# Patient Record
Sex: Male | Born: 1945 | Race: White | Hispanic: No | Marital: Married | Smoking: Former smoker
Health system: Southern US, Community
[De-identification: ages and names within clinical notes are randomized; demographics above are authoritative.]

---

## 1999-10-20 ENCOUNTER — Emergency Department (HOSPITAL_COMMUNITY): Admission: EM | Admit: 1999-10-20 | Discharge: 1999-10-20 | Payer: Self-pay | Admitting: *Deleted

## 2004-09-11 ENCOUNTER — Encounter: Admission: RE | Admit: 2004-09-11 | Discharge: 2004-09-11 | Payer: Self-pay | Admitting: Endocrinology

## 2005-01-14 ENCOUNTER — Encounter (INDEPENDENT_AMBULATORY_CARE_PROVIDER_SITE_OTHER): Payer: Self-pay | Admitting: *Deleted

## 2005-01-14 ENCOUNTER — Ambulatory Visit (HOSPITAL_COMMUNITY): Admission: RE | Admit: 2005-01-14 | Discharge: 2005-01-14 | Payer: Self-pay | Admitting: Gastroenterology

## 2007-06-30 ENCOUNTER — Encounter: Admission: RE | Admit: 2007-06-30 | Discharge: 2007-06-30 | Payer: Self-pay | Admitting: Rheumatology

## 2007-08-13 ENCOUNTER — Encounter: Admission: RE | Admit: 2007-08-13 | Discharge: 2007-08-13 | Payer: Self-pay | Admitting: Rheumatology

## 2009-05-23 IMAGING — US US ABDOMEN COMPLETE
1 series · 13 of 25 positions shown · non-contrast
Comparison: none

CLINICAL DATA: Leukopenia.  Rheumatoid arthritis.  
 ABDOMEN ULTRASOUND:
TECHNIQUE: Complete abdominal ultrasound examination was performed including evaluation of the liver, gallbladder, bile ducts, pancreas, kidneys, spleen, IVC, and abdominal aorta.

[Series 1: us abdomen complete · 0.28mm/px · 13 of 114 slices shown]
[im 1/114]
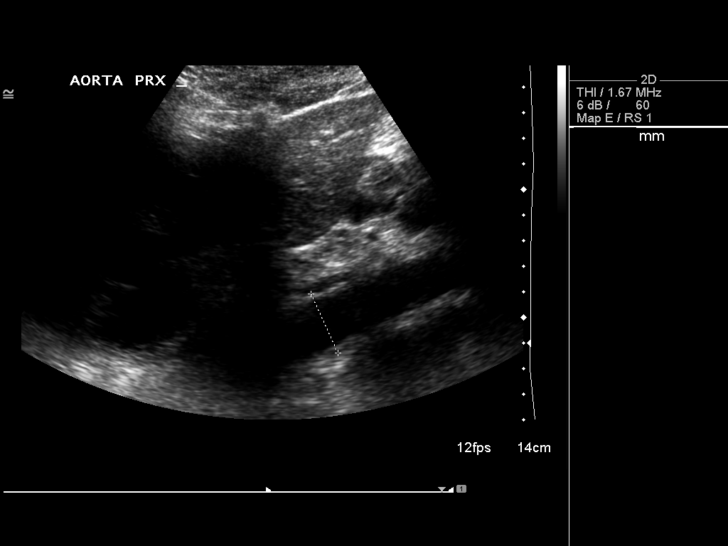
[im 10/114]
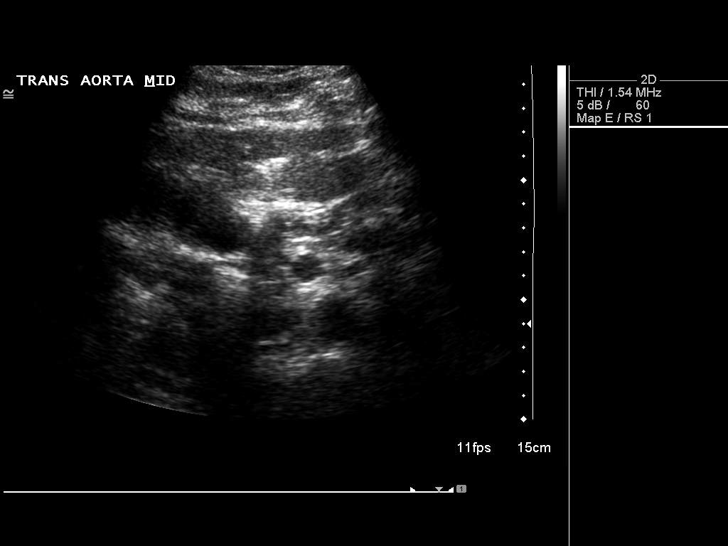
[im 19/114]
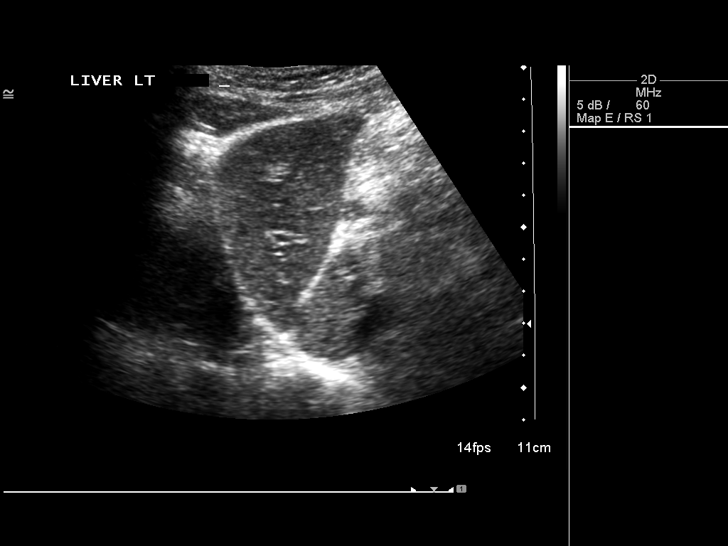
[im 29/114]
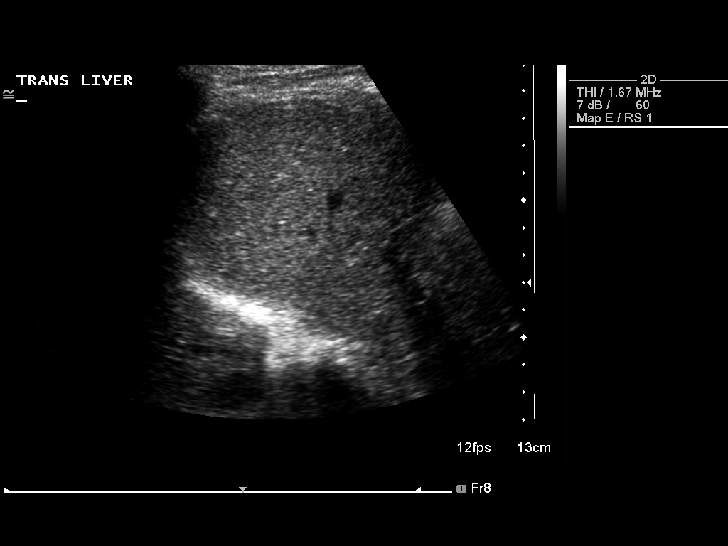
[im 38/114]
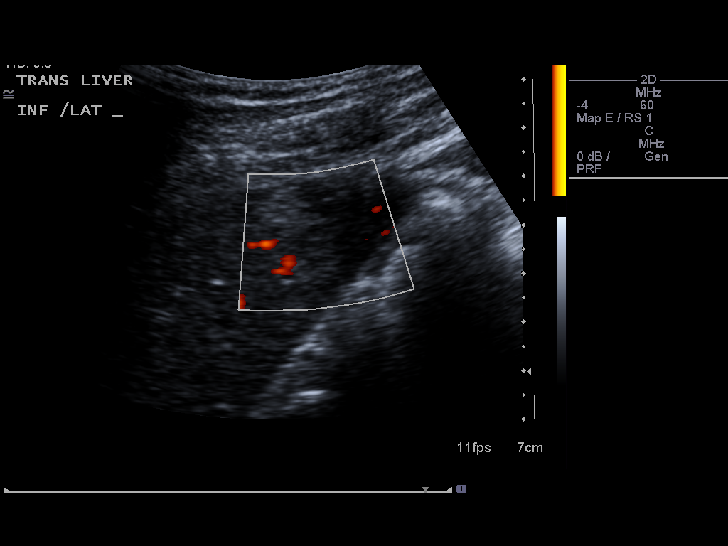
[im 48/114]
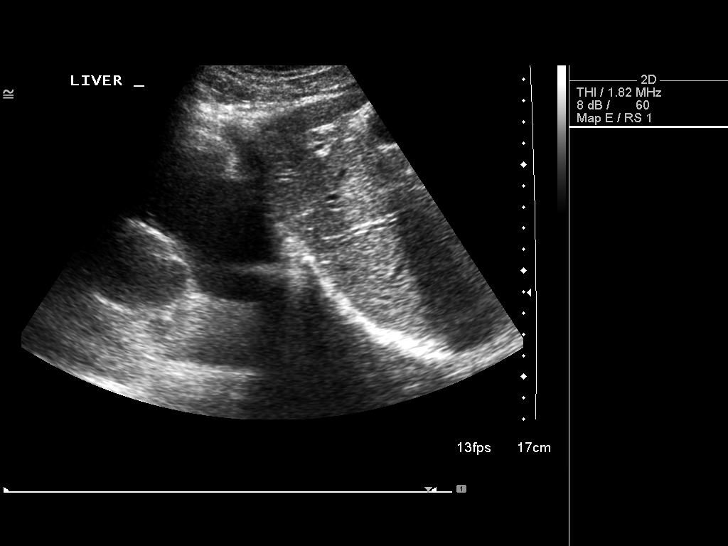
[im 57/114]
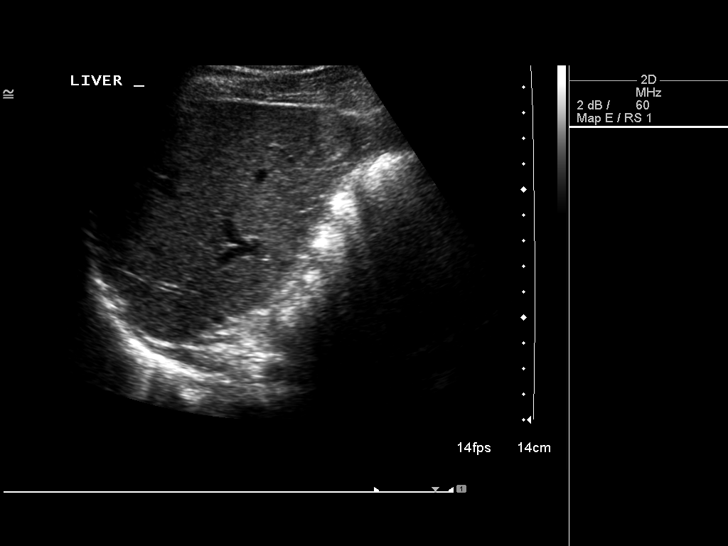
[im 66/114]
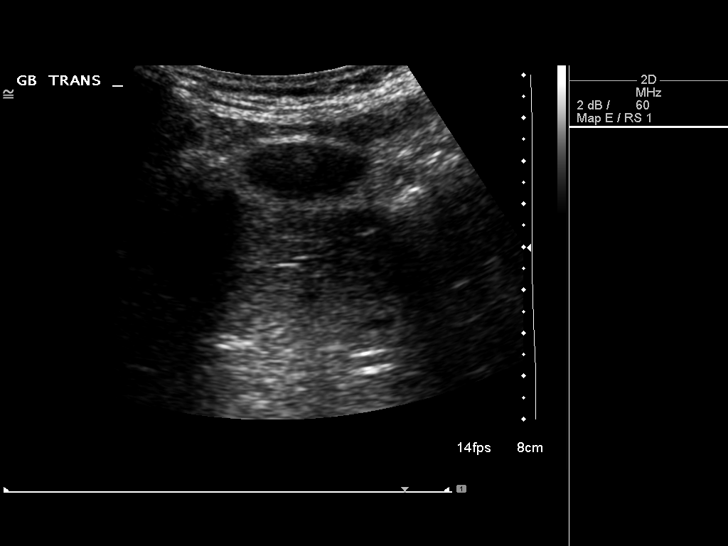
[im 76/114]
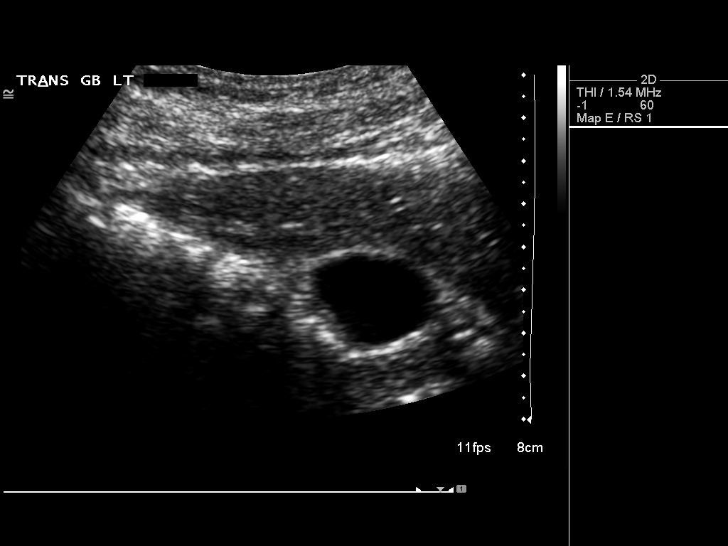
[im 85/114]
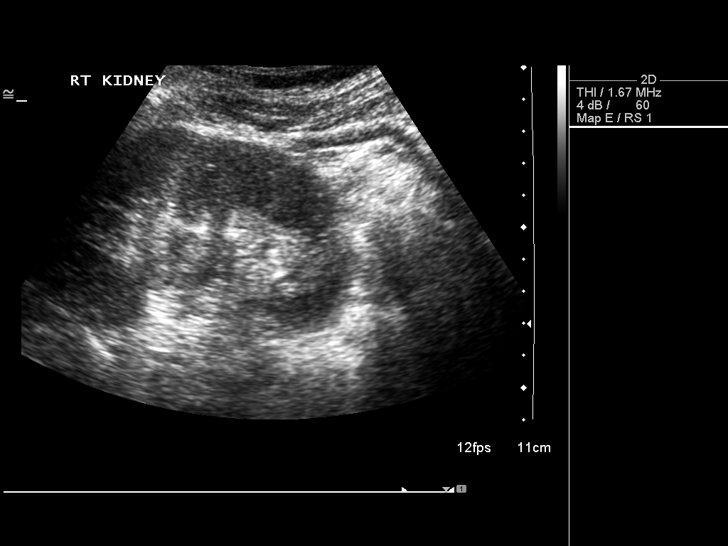
[im 95/114]
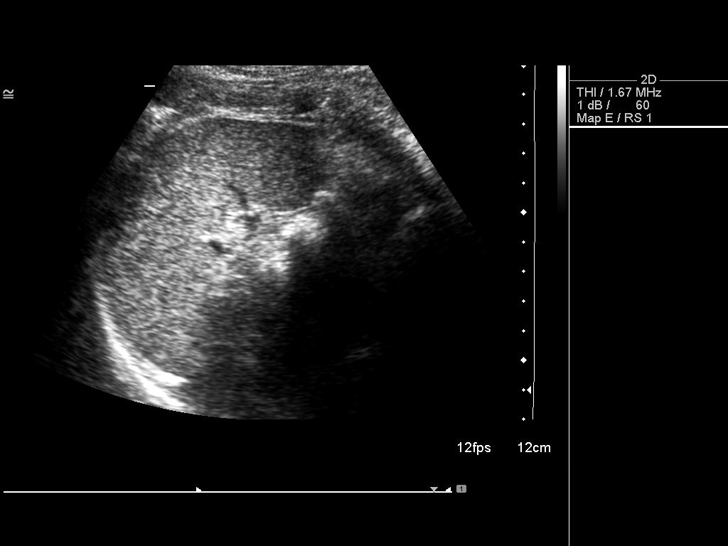
[im 104/114]
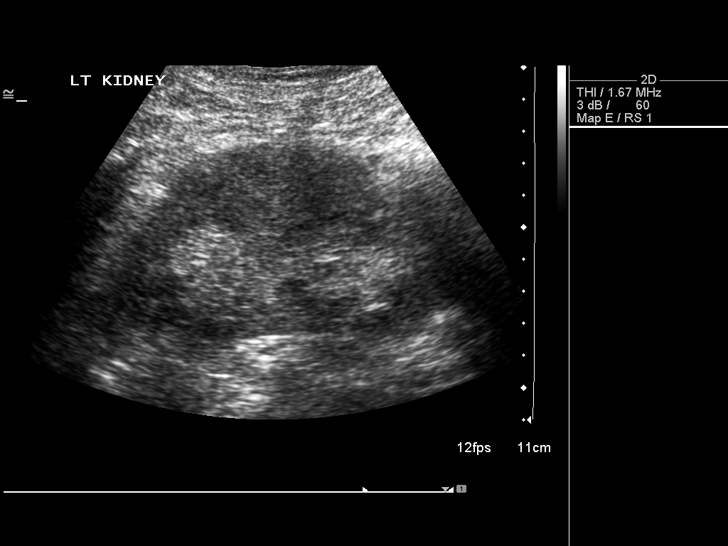
[im 114/114]
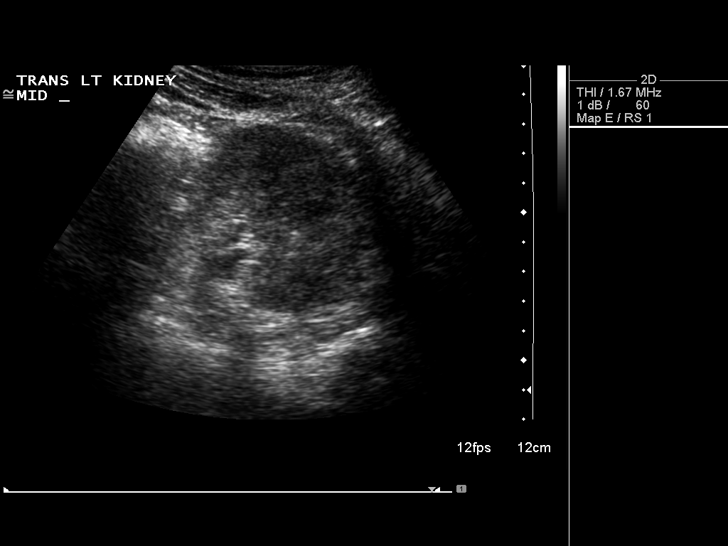

[13 of 25 positions shown; findings below may reference images not displayed]

FINDINGS: The gallbladder is normal.  There are no dilated bowel ducts.  Maximum diameter of the common bile duct is 3.1 mm.  There is a subtle inhomogeneous slightly hyperechoic 2.2 x 1.6 x 2.1 cm lesion in the anterior aspect of the right lobe of the liver inferiorly and laterally just lateral to the gallbladder.  It is slightly lobular in appearance, and the appearance is not completely consistent with a benign hemangioma.  The remainder of the liver parenchyma is normal.  The inferior vena cava is normal.  The spleen is normal measuring 10.4 cm in maximum diameter.  Both kidneys are 10.9 cm in length and the left kidney is normal.  There is a 4 mm echogenic area laterally in the right kidney probably a tiny angiomyolipoma.  This is not felt to be significant.  The maximum diameter of the abdominal aorta is 2.5 cm.  
 The head and body of the pancreas appear normal.  The tail of the pancreas is partially obscured by bowel gas.
IMPRESSION: 1.  2.2 cm in maximum diameter lobulated inhomogeneous lesion in the inferolateral aspect of the right lobe of the liver.  This could represent an atypical hemangioma, but I recommend CT scan of the liver with contrast with multiphase imaging for further evaluation unless the patient has had outside prior imaging studies of the liver.  We have none in our system.
 2.  Specifically, the spleen appears normal in size.

## 2009-07-06 IMAGING — CT CT ABDOMEN WO/W CM
2 of 4 series · 17 of 46 positions shown, 19 images · IV contrast (READICAT/WATER)
Comparison: Abdominal ultrasound 06/30/07.

CLINICAL DATA: Weight loss with liver lesion on ultrasound.  No abdominal complaints or history of malignancy.
 ABDOMEN CT WITHOUT AND WITH CONTRAST ? 08/13/07:
TECHNIQUE: Multidetector CT imaging of the abdomen was performed following the standard protocol both before and during bolus administration of intravenous contrast.
 Contrast:  100 cc Omnipaque 350 IV.  Oral contrast was given.

[Series 3: arterial/venous liver · axial · arterial · 0.66mm/px · z∈[-197,-12]mm · 14 of 122 slices shown, 16 images]
[im 6/122  soft-tissue]
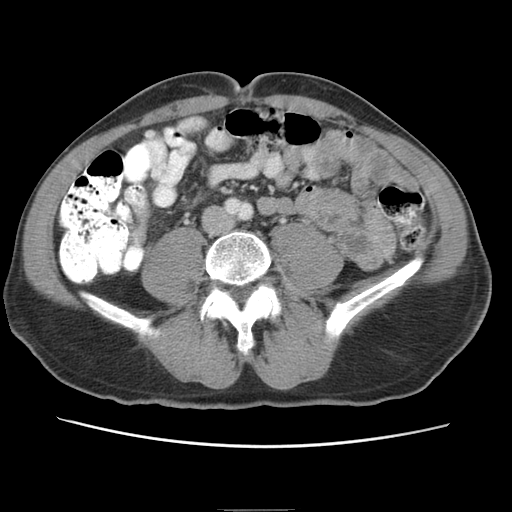
[im 6/122  bone]
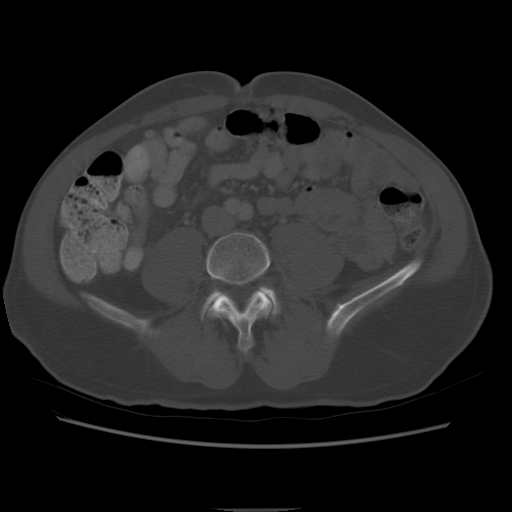
[im 16/122  soft-tissue]
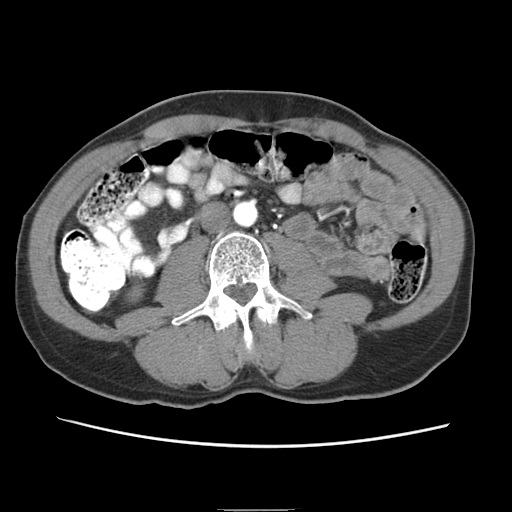
[im 22/122  soft-tissue]
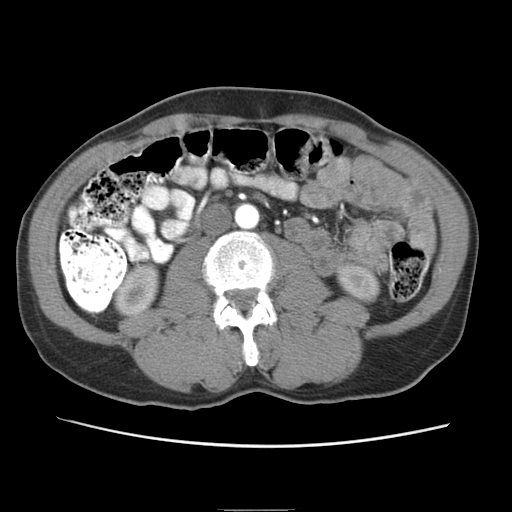
[im 32/122  soft-tissue]
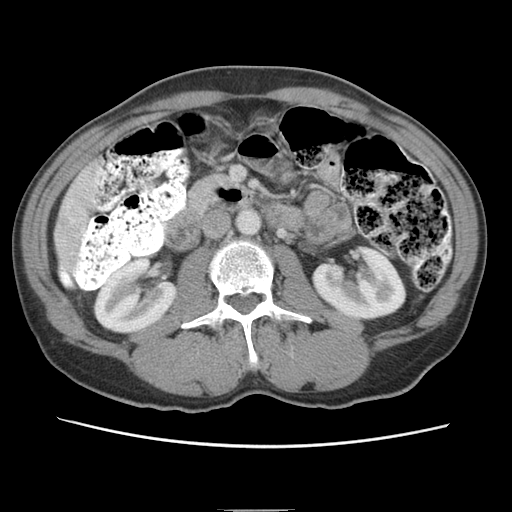
[im 43/122  soft-tissue]
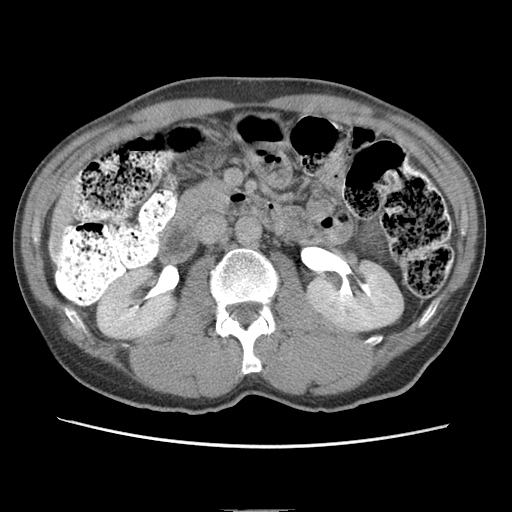
[im 48/122  soft-tissue]
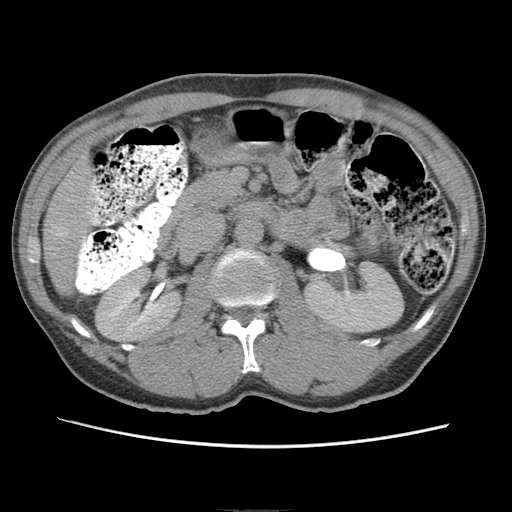
[im 58/122  soft-tissue]
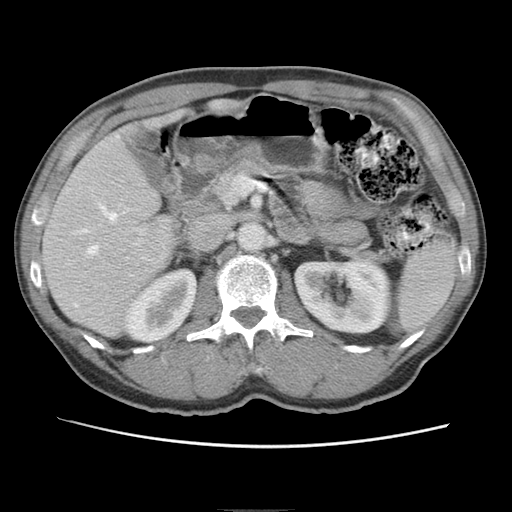
[im 64/122  soft-tissue]
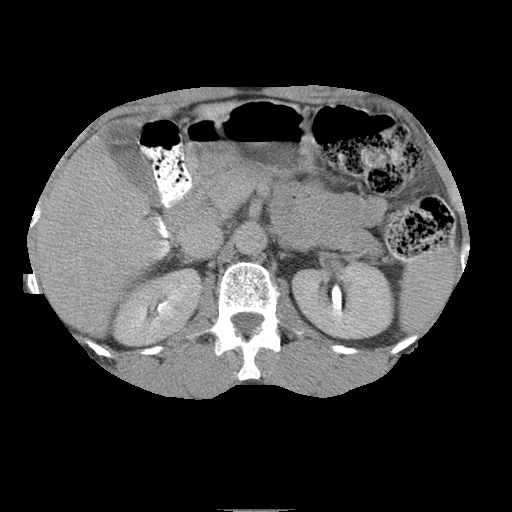
[im 74/122  soft-tissue]
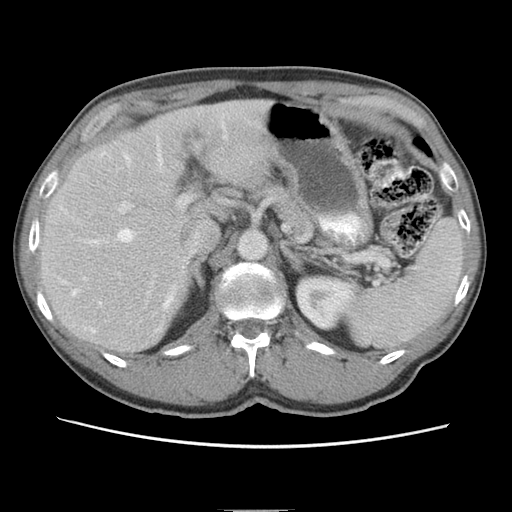
[im 74/122  bone]
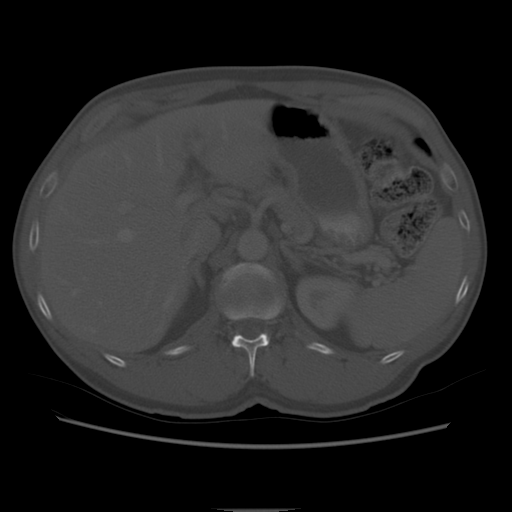
[im 79/122  soft-tissue]
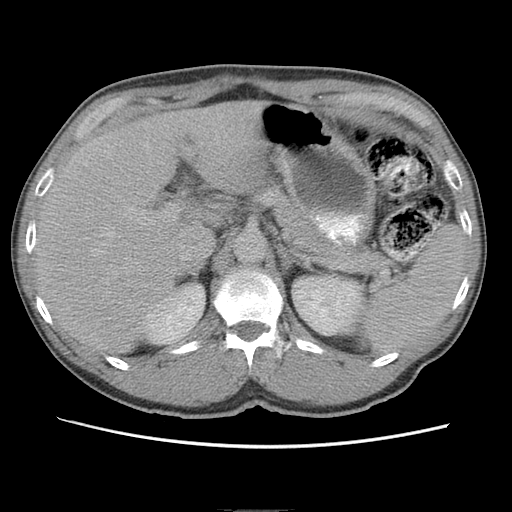
[im 90/122  soft-tissue]
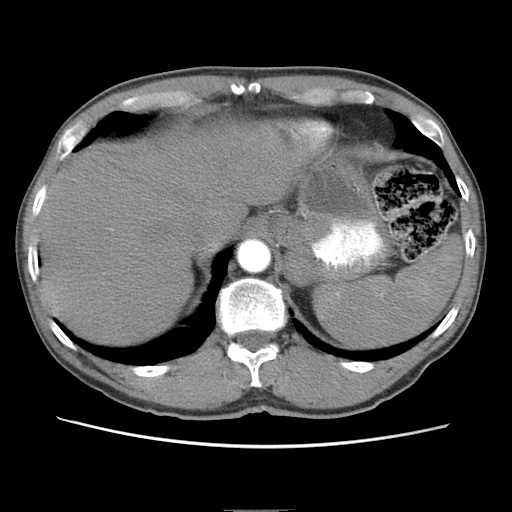
[im 100/122  soft-tissue]
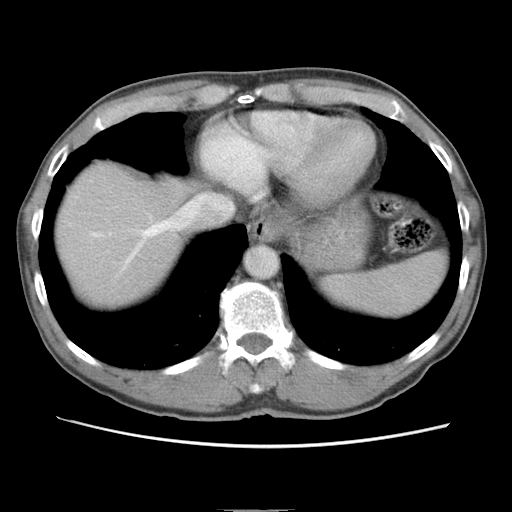
[im 106/122  soft-tissue]
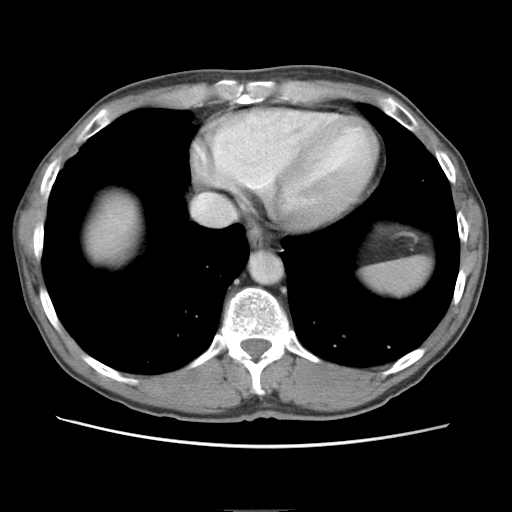
[im 116/122  soft-tissue]
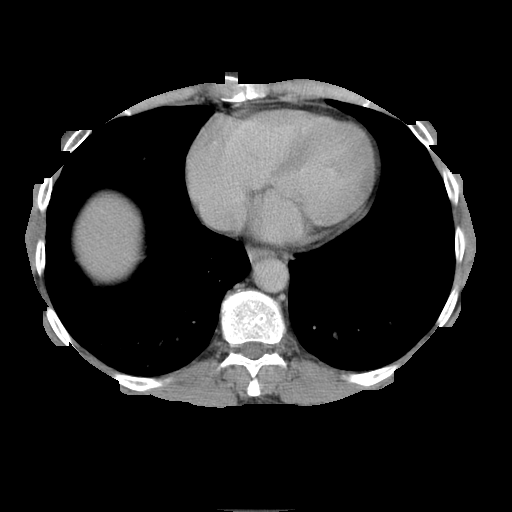

[Series 104: coronal abd · coronal · 0.66mm/px · 3 of 110 slices shown]
[im 37/110  soft-tissue]
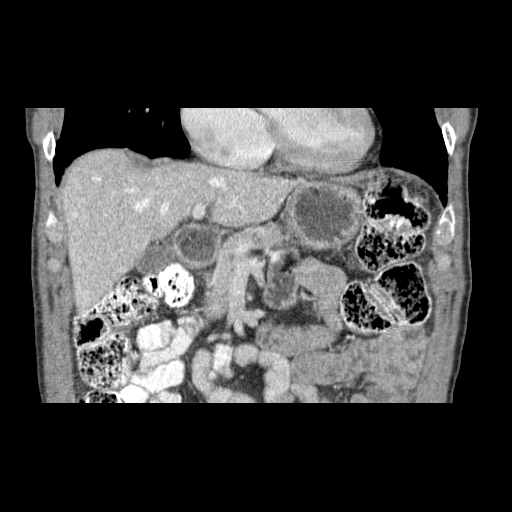
[im 49/110  soft-tissue]
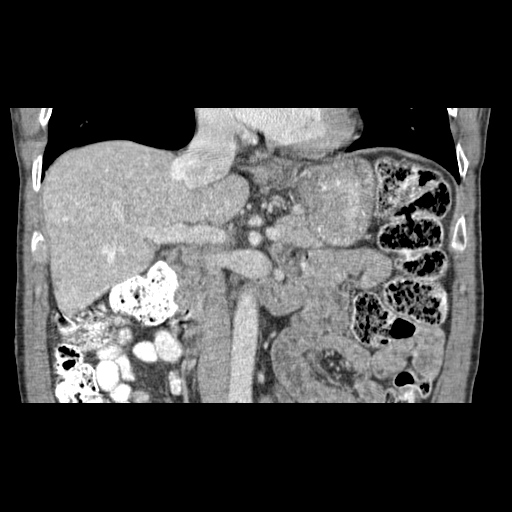
[im 61/110  soft-tissue]
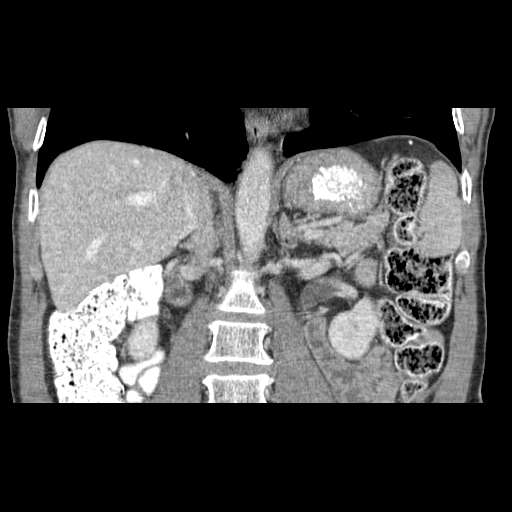

[17 of 46 positions shown; findings below may reference images not displayed]

FINDINGS: Corresponding with the lesion demonstrated on ultrasound is a 1.3 x 2.1 x 2.1 cm lesion inferiorly in the right hepatic lobe.  This is best seen on the portal phase images.  The lesion demonstrates peripheral discontinuous enhancement which progresses centrally.  On delayed imaging through the kidneys, the lesion is nearly fully opacified.  The CT appearance is typical of a hemangioma.  No other liver lesions are identified.
 The spleen, adrenal glands, gallbladder, pancreas, and kidneys appear unremarkable.  No enlarged abdominal lymph nodes are seen.  There is a moderate amount of stool throughout the colon.  Posteriorly in the upper pole of the right kidney is a 6 mm lesion which shows low density.  On the sagittal reconstructed images, I am able to document attenuation of minus 22 Hounsfield units, and this may reflect a small angiomyolipoma suggested on the prior ultrasound.
IMPRESSION: 1.  The liver lesion demonstrates CT features typical of a hemangioma.  There are no suspicious liver lesions.
 2.  6 mm right renal angiomyolipoma.

## 2011-05-03 NOTE — Op Note (Signed)
NAME:  JERIMAH, WITUCKI NO.:  [id number]   MEDICAL RECORD NO.:  [id number]          PATIENT TYPE:  AMB   LOCATION:  ENDO                         FACILITY:  [facility]   PHYSICIAN:  Petra Kuba, M.D.    DATE OF BIRTH:  [birth year]   DATE OF PROCEDURE:  01/14/2005  DATE OF DISCHARGE:                                 OPERATIVE REPORT   PROCEDURE:  Colonoscopy with polypectomy.   INDICATION:  Screening.   Consent was signed after the risks, benefits, methods, and options were  thoroughly discussed in the office.   MEDICINES USED:  Demerol 75 mg, Versed 7.5 mg.   DESCRIPTION OF PROCEDURE:  Rectal inspection was pertinent for external  hemorrhoids, small.  Digital exam was negative.  The video pediatric  adjustable colonoscope was inserted and with abdominal pressure easily able  to advance around the colon to the cecum.  No abnormalities were seen on  insertion.  The cecum was identified by the appendiceal orifice and the  ileocecal valve.  The prep was adequate, there was some liquid stool that  required washing and suctioning.  On slow withdrawal through the colon, in  the mid ascending a small polyp was seen, snare electrocautery applied and  the polyps was suctioned through the scope and collected in the trap.  The  scope was further withdrawn.  There were some occasional left-sided  diverticula but no other abnormalities as we slowly withdrew back to the  rectum.  Anorectal pull-through on retroflexion revealed some small  hemorrhoids but also revealed a tiny polyp on retroflexion which was hot  biopsied x1 and put in a separate container.  The scope was straightened and  readvanced shortly to the left side of the colon, air was suctioned and the  scope removed.  The patient tolerated the procedure well and there were no  obvious immediate complications.   ENDOSCOPIC DIAGNOSES:  1.  Internal and external hemorrhoids.  2.  Left-sided diverticula.  3.  Tiny rectal  polyp, hot biopsied on retroflexion.  4.  Ascending small polyp.  5.  Otherwise within normal limits to the cecum.   PLAN:  Await pathology, happy to see him back p.r.n., otherwise return care  to Drs. Lucianne Muss and Truslow for customary healthcare maintenance to include  yearly rectal guaiacs.      MEM/MEDQ  D:  01/14/2005  T:  01/14/2005  Job:  [id number]   cc:   Aundra Dubin, M.D.   Reather Littler, M.D.  [address]  [place]  [place] [address]  Fax: [phone]

## 2011-07-18 ENCOUNTER — Encounter (INDEPENDENT_AMBULATORY_CARE_PROVIDER_SITE_OTHER): Payer: Medicare Other | Admitting: Ophthalmology

## 2011-07-18 DIAGNOSIS — H2510 Age-related nuclear cataract, unspecified eye: Secondary | ICD-10-CM

## 2011-07-18 DIAGNOSIS — E11319 Type 2 diabetes mellitus with unspecified diabetic retinopathy without macular edema: Secondary | ICD-10-CM

## 2011-07-18 DIAGNOSIS — H43819 Vitreous degeneration, unspecified eye: Secondary | ICD-10-CM

## 2012-01-02 DIAGNOSIS — M810 Age-related osteoporosis without current pathological fracture: Secondary | ICD-10-CM | POA: Diagnosis not present

## 2012-01-02 DIAGNOSIS — E109 Type 1 diabetes mellitus without complications: Secondary | ICD-10-CM | POA: Diagnosis not present

## 2012-01-02 DIAGNOSIS — I10 Essential (primary) hypertension: Secondary | ICD-10-CM | POA: Diagnosis not present

## 2012-01-02 DIAGNOSIS — E7800 Pure hypercholesterolemia, unspecified: Secondary | ICD-10-CM | POA: Diagnosis not present

## 2012-01-14 ENCOUNTER — Encounter: Payer: Medicare Other | Attending: Endocrinology | Admitting: *Deleted

## 2012-01-14 DIAGNOSIS — Z713 Dietary counseling and surveillance: Secondary | ICD-10-CM | POA: Diagnosis not present

## 2012-01-14 DIAGNOSIS — E109 Type 1 diabetes mellitus without complications: Secondary | ICD-10-CM | POA: Diagnosis not present

## 2012-01-14 NOTE — Progress Notes (Signed)
  Medical Nutrition Therapy:  Appt start time: 0900 end time:  1000.  Assessment:  Primary concerns today: Patient here with his wife for diabetes education. He states he was diagnosed with type 1 diabetes in 1985 and started on the Animas insulin pump more than 10 years ago. He has not seen a dietitian for greater than 5 years and has not received any additional pump education since he was started on it initially. He states his grand daughter can download his pump and print the reports for him. His wife offered comments throughout the visit as to her concerns when he has hypoglycemia and that he doesn't always check his BG before he eats a meal.   MEDICATIONS: patient did not provide list of medications at this visit. Plan to obtain at follow up visit    DIETARY INTAKE: Wife states she prepares "healthy" meals at home.  Usual physical activity: not covered at this visit  Progress Towards Goal(s):  In progress.    Intervention:  Based on his lack of recent diabetes education and pump follow up, I have asked him to download his pump and print the reports one week before the next follow up visit so we can review them together. Based on his wife's concerns that he makes insulin decisions when his BG is below 60, we discussed that he allow her to direct him to delay bolusing until after he has eaten and then she help him remember to bolus at that time. At the next visit we will review his pump download reports, review carb counting especially in a restaurant setting and determine what advanced features on the pump might be helpful. Plan: Work with your grand daughter to download the [place] Pump and print the reports 1 week prior to next visit When BG is below 60, discuss insulin dose decisions with Ladona Horns will help to remind to bolus if insulin dose is delayed to after the meal due to being too low  Monitoring/Evaluation:  Dietary intake, exercise, insulin pump download reports.  Plan follow up  in 4 weeks.

## 2012-01-14 NOTE — Patient Instructions (Signed)
Plan: Work with your grand daughter to download the [facility] Pump and print the reports 1 week prior to next visit When BG is below 60, discuss insulin dose decisions with Ladona Horns will help to remind to bolus if insulin dose is delayed to after the meal due to being too low

## 2012-01-20 DIAGNOSIS — E785 Hyperlipidemia, unspecified: Secondary | ICD-10-CM | POA: Diagnosis not present

## 2012-01-20 DIAGNOSIS — I10 Essential (primary) hypertension: Secondary | ICD-10-CM | POA: Diagnosis not present

## 2012-01-20 DIAGNOSIS — E109 Type 1 diabetes mellitus without complications: Secondary | ICD-10-CM | POA: Diagnosis not present

## 2012-01-20 DIAGNOSIS — E7800 Pure hypercholesterolemia, unspecified: Secondary | ICD-10-CM | POA: Diagnosis not present

## 2012-01-20 DIAGNOSIS — Z125 Encounter for screening for malignant neoplasm of prostate: Secondary | ICD-10-CM | POA: Diagnosis not present

## 2012-01-20 DIAGNOSIS — M810 Age-related osteoporosis without current pathological fracture: Secondary | ICD-10-CM | POA: Diagnosis not present

## 2012-01-20 DIAGNOSIS — Z0000 Encounter for general adult medical examination without abnormal findings: Secondary | ICD-10-CM | POA: Diagnosis not present

## 2012-01-27 DIAGNOSIS — I10 Essential (primary) hypertension: Secondary | ICD-10-CM | POA: Diagnosis not present

## 2012-01-27 DIAGNOSIS — E7800 Pure hypercholesterolemia, unspecified: Secondary | ICD-10-CM | POA: Diagnosis not present

## 2012-01-27 DIAGNOSIS — E1065 Type 1 diabetes mellitus with hyperglycemia: Secondary | ICD-10-CM | POA: Diagnosis not present

## 2012-02-04 ENCOUNTER — Encounter: Payer: Medicare Other | Attending: Endocrinology | Admitting: *Deleted

## 2012-02-04 ENCOUNTER — Encounter: Payer: Self-pay | Admitting: *Deleted

## 2012-02-04 DIAGNOSIS — Z713 Dietary counseling and surveillance: Secondary | ICD-10-CM | POA: Insufficient documentation

## 2012-02-04 DIAGNOSIS — E109 Type 1 diabetes mellitus without complications: Secondary | ICD-10-CM | POA: Diagnosis not present

## 2012-02-04 NOTE — Progress Notes (Signed)
  Medical Nutrition Therapy:  Appt start time: 1000 end time:  1100.  Assessment:  Primary concerns today: Pateint returns for follow up visit along with his wife. He mailed his BG Log sheet to me which we reviewed.  He did not have the computer download of his Animas pump as his grand daughter was not available to do so. He plans to see her this weekend and I requested they be emailed to me for review. Wife states he still resists her intervention when he has low BG, so we reviewed the agreement made regarding her "being in charge " when his BG is below 70 mg/dl.  Interested in learning more about Advanced Features on his pump today.  MEDICATIONS: patient did not provide list of medications at this visit. DIETARY INTAKE: Wife states she prepares "healthy" meals at home.  Usual physical activity: his BG drops very quickly with any increase in activity level. He walks with wife occasionally and works in the yard some, but has to be very careful due to hypoglycemia.  Progress Towards Goal(s):  In progress.    Intervention:  Discussed opportunities to use Temp Basal and Extended Bolus options on insulin pump. Reviewed difference between basal (non-food insulin) and bolus (food plus correction insulin). Taught button pushing to get to Temp Basal and suggested starting it 30 minutes prior to increase in activity level, length of activity and 30 minutes after completed. Suggested he start at 50% for moderate and 0% for any aerobic activity for now. Also suggested use of Extended Bolus for higher fat meals and to use a duration of 2 hours for now. Plan: Continue to check BG pre and post meal as directed by MD Continue to aim for 3-4 Carb Choices per meal, 0-2 per snack if hungry Start using Temp Basal rate when more active such as shopping or working in the yard by reducing 20-50% Start using Extended Bolus for higher fat meals such as restaurants or pizza. Use duration of 2 hours Consider a 20% reduction  Temp Basal at night if recovering from a low BG before bedtime Attempt to again download pump and send reports as an attachment to me via email  Monitoring/Evaluation:  Dietary intake, exercise, insulin pump download reports.  Plan follow up in 4 - 6 weeks.

## 2012-02-05 NOTE — Patient Instructions (Addendum)
Plan: Continue to check BG pre and post meal as directed by MD Continue to aim for 3-4 Carb Choices per meal, 0-2 per snack if hungry Start using Temp Basal rate when more active such as shopping or working in the yard by reducing 20-50% Start using Extended Bolus for higher fat meals such as restaurants or pizza. Use duration of 2 hours Consider a 20% reduction Temp Basal at night if recovering from a low BG before bedtime

## 2012-02-26 DIAGNOSIS — M069 Rheumatoid arthritis, unspecified: Secondary | ICD-10-CM | POA: Diagnosis not present

## 2012-02-26 DIAGNOSIS — Z79899 Other long term (current) drug therapy: Secondary | ICD-10-CM | POA: Diagnosis not present

## 2012-03-03 ENCOUNTER — Encounter: Payer: Medicare Other | Attending: Endocrinology | Admitting: *Deleted

## 2012-03-03 DIAGNOSIS — E109 Type 1 diabetes mellitus without complications: Secondary | ICD-10-CM | POA: Insufficient documentation

## 2012-03-03 DIAGNOSIS — Z713 Dietary counseling and surveillance: Secondary | ICD-10-CM | POA: Insufficient documentation

## 2012-03-03 NOTE — Progress Notes (Signed)
  Medical Nutrition Therapy:  Appt start time: 0930 end time:  1000.  Assessment:  Primary concerns today: Patient returns for follow up visit along with his wife again. He did bring computer reports from [place] pump which shows total daily information but not broken down into times of day so pre and post meal data not provided to assess basal and bolus setting accuracy. He confirms wife's comment that he has not used Temp Basal for increased activity yet and he explains he doesn't remember how to do that on his pump. They are using the new "rule" that if his BG is below 70 mg/dl that she is then "in charge" of treatment decisions.  MEDICATIONS: patient did not provide list of medications at this visit. DIETARY INTAKE: Wife has stated she prepares "healthy" meals at home.  Usual physical activity: his BG drops very quickly with any increase in activity level. 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    Intervention: Reviewed steps for using Temp Basal and wrote them down for him to take with him. Discussed opportunity for him to wear CGM for 3 days to collect more data on BG patterns and better enable [place] to make more accurate decisions on pump settings. His basal rate has 6 different profiles that have 100% variability, and these settings have been adjusted over several years, so I feel a thorough re-assessment of these settings is appropriate. MD has approved for him to wear CGM, so we will set that up ASAP.  Plan: Continue to consider using Temp Basal rate when more active or have multiple errands to run by reducing 20-50% to help prevent low BG Continue to check BG pre and post meal as directed by MD Continue to aim for 3-4 Carb Choices per meal, 0-2 per snack if hungry Consider a 20% reduction Temp Basal at night if recovering from a low BG before bedtime    Monitoring/Evaluation:  Dietary intake, exercise, CGM appointment.  Plan follow up in 4 - 6 weeks.

## 2012-03-04 DIAGNOSIS — Z79899 Other long term (current) drug therapy: Secondary | ICD-10-CM | POA: Diagnosis not present

## 2012-03-04 DIAGNOSIS — M069 Rheumatoid arthritis, unspecified: Secondary | ICD-10-CM | POA: Diagnosis not present

## 2012-03-04 DIAGNOSIS — D696 Thrombocytopenia, unspecified: Secondary | ICD-10-CM | POA: Diagnosis not present

## 2012-03-05 ENCOUNTER — Encounter: Payer: Self-pay | Admitting: *Deleted

## 2012-03-05 NOTE — Patient Instructions (Signed)
Plan: Continue to consider using Temp Basal rate when more active or have multiple errands to run by reducing 20-50% to help prevent low BG Continue to check BG pre and post meal as directed by MD Continue to aim for 3-4 Carb Choices per meal, 0-2 per snack if hungry Consider a 20% reduction Temp Basal at night if recovering from a low BG before bedtime

## 2012-03-30 DIAGNOSIS — E109 Type 1 diabetes mellitus without complications: Secondary | ICD-10-CM | POA: Diagnosis not present

## 2012-03-30 DIAGNOSIS — E559 Vitamin D deficiency, unspecified: Secondary | ICD-10-CM | POA: Diagnosis not present

## 2012-03-30 DIAGNOSIS — E7800 Pure hypercholesterolemia, unspecified: Secondary | ICD-10-CM | POA: Diagnosis not present

## 2012-03-30 DIAGNOSIS — I10 Essential (primary) hypertension: Secondary | ICD-10-CM | POA: Diagnosis not present

## 2012-05-21 DIAGNOSIS — Z79899 Other long term (current) drug therapy: Secondary | ICD-10-CM | POA: Diagnosis not present

## 2012-05-21 DIAGNOSIS — M069 Rheumatoid arthritis, unspecified: Secondary | ICD-10-CM | POA: Diagnosis not present

## 2012-06-30 DIAGNOSIS — E109 Type 1 diabetes mellitus without complications: Secondary | ICD-10-CM | POA: Diagnosis not present

## 2012-06-30 DIAGNOSIS — E7800 Pure hypercholesterolemia, unspecified: Secondary | ICD-10-CM | POA: Diagnosis not present

## 2012-06-30 DIAGNOSIS — I10 Essential (primary) hypertension: Secondary | ICD-10-CM | POA: Diagnosis not present

## 2012-07-17 ENCOUNTER — Ambulatory Visit (INDEPENDENT_AMBULATORY_CARE_PROVIDER_SITE_OTHER): Payer: Medicare Other | Admitting: Ophthalmology

## 2012-07-17 DIAGNOSIS — H35039 Hypertensive retinopathy, unspecified eye: Secondary | ICD-10-CM

## 2012-07-17 DIAGNOSIS — E1139 Type 2 diabetes mellitus with other diabetic ophthalmic complication: Secondary | ICD-10-CM

## 2012-07-17 DIAGNOSIS — E11319 Type 2 diabetes mellitus with unspecified diabetic retinopathy without macular edema: Secondary | ICD-10-CM

## 2012-07-17 DIAGNOSIS — H43819 Vitreous degeneration, unspecified eye: Secondary | ICD-10-CM | POA: Diagnosis not present

## 2012-07-17 DIAGNOSIS — H2510 Age-related nuclear cataract, unspecified eye: Secondary | ICD-10-CM | POA: Diagnosis not present

## 2012-07-17 DIAGNOSIS — I10 Essential (primary) hypertension: Secondary | ICD-10-CM

## 2012-07-20 DIAGNOSIS — E559 Vitamin D deficiency, unspecified: Secondary | ICD-10-CM | POA: Diagnosis not present

## 2012-07-20 DIAGNOSIS — E7800 Pure hypercholesterolemia, unspecified: Secondary | ICD-10-CM | POA: Diagnosis not present

## 2012-07-20 DIAGNOSIS — I10 Essential (primary) hypertension: Secondary | ICD-10-CM | POA: Diagnosis not present

## 2012-07-20 DIAGNOSIS — M810 Age-related osteoporosis without current pathological fracture: Secondary | ICD-10-CM | POA: Diagnosis not present

## 2012-07-27 DIAGNOSIS — E7800 Pure hypercholesterolemia, unspecified: Secondary | ICD-10-CM | POA: Diagnosis not present

## 2012-07-27 DIAGNOSIS — I10 Essential (primary) hypertension: Secondary | ICD-10-CM | POA: Diagnosis not present

## 2012-07-27 DIAGNOSIS — E559 Vitamin D deficiency, unspecified: Secondary | ICD-10-CM | POA: Diagnosis not present

## 2012-09-02 DIAGNOSIS — D696 Thrombocytopenia, unspecified: Secondary | ICD-10-CM | POA: Diagnosis not present

## 2012-09-02 DIAGNOSIS — Z79899 Other long term (current) drug therapy: Secondary | ICD-10-CM | POA: Diagnosis not present

## 2012-09-02 DIAGNOSIS — M069 Rheumatoid arthritis, unspecified: Secondary | ICD-10-CM | POA: Diagnosis not present

## 2012-09-30 DIAGNOSIS — E1029 Type 1 diabetes mellitus with other diabetic kidney complication: Secondary | ICD-10-CM | POA: Diagnosis not present

## 2012-09-30 DIAGNOSIS — Z23 Encounter for immunization: Secondary | ICD-10-CM | POA: Diagnosis not present

## 2012-12-23 DIAGNOSIS — M069 Rheumatoid arthritis, unspecified: Secondary | ICD-10-CM | POA: Diagnosis not present

## 2012-12-23 DIAGNOSIS — Z79899 Other long term (current) drug therapy: Secondary | ICD-10-CM | POA: Diagnosis not present

## 2013-01-13 DIAGNOSIS — I10 Essential (primary) hypertension: Secondary | ICD-10-CM | POA: Diagnosis not present

## 2013-01-13 DIAGNOSIS — E109 Type 1 diabetes mellitus without complications: Secondary | ICD-10-CM | POA: Diagnosis not present

## 2013-01-13 DIAGNOSIS — E1029 Type 1 diabetes mellitus with other diabetic kidney complication: Secondary | ICD-10-CM | POA: Diagnosis not present

## 2013-01-20 DIAGNOSIS — Z0000 Encounter for general adult medical examination without abnormal findings: Secondary | ICD-10-CM | POA: Diagnosis not present

## 2013-01-20 DIAGNOSIS — I10 Essential (primary) hypertension: Secondary | ICD-10-CM | POA: Diagnosis not present

## 2013-01-20 DIAGNOSIS — Z125 Encounter for screening for malignant neoplasm of prostate: Secondary | ICD-10-CM | POA: Diagnosis not present

## 2013-01-20 DIAGNOSIS — M810 Age-related osteoporosis without current pathological fracture: Secondary | ICD-10-CM | POA: Diagnosis not present

## 2013-01-20 DIAGNOSIS — E7800 Pure hypercholesterolemia, unspecified: Secondary | ICD-10-CM | POA: Diagnosis not present

## 2013-01-20 DIAGNOSIS — E1029 Type 1 diabetes mellitus with other diabetic kidney complication: Secondary | ICD-10-CM | POA: Diagnosis not present

## 2013-01-20 DIAGNOSIS — Z1331 Encounter for screening for depression: Secondary | ICD-10-CM | POA: Diagnosis not present

## 2013-02-02 DIAGNOSIS — I10 Essential (primary) hypertension: Secondary | ICD-10-CM | POA: Diagnosis not present

## 2013-02-02 DIAGNOSIS — E1029 Type 1 diabetes mellitus with other diabetic kidney complication: Secondary | ICD-10-CM | POA: Diagnosis not present

## 2013-02-02 DIAGNOSIS — E7800 Pure hypercholesterolemia, unspecified: Secondary | ICD-10-CM | POA: Diagnosis not present

## 2013-02-25 DIAGNOSIS — M069 Rheumatoid arthritis, unspecified: Secondary | ICD-10-CM | POA: Diagnosis not present

## 2013-02-25 DIAGNOSIS — Z79899 Other long term (current) drug therapy: Secondary | ICD-10-CM | POA: Diagnosis not present

## 2013-02-25 DIAGNOSIS — D696 Thrombocytopenia, unspecified: Secondary | ICD-10-CM | POA: Diagnosis not present

## 2013-04-14 DIAGNOSIS — Z79899 Other long term (current) drug therapy: Secondary | ICD-10-CM | POA: Diagnosis not present

## 2013-04-14 DIAGNOSIS — M069 Rheumatoid arthritis, unspecified: Secondary | ICD-10-CM | POA: Diagnosis not present

## 2013-04-14 DIAGNOSIS — E1029 Type 1 diabetes mellitus with other diabetic kidney complication: Secondary | ICD-10-CM | POA: Diagnosis not present

## 2013-07-19 ENCOUNTER — Ambulatory Visit (INDEPENDENT_AMBULATORY_CARE_PROVIDER_SITE_OTHER): Payer: Medicare Other | Admitting: Ophthalmology

## 2013-07-20 DIAGNOSIS — I10 Essential (primary) hypertension: Secondary | ICD-10-CM | POA: Diagnosis not present

## 2013-07-20 DIAGNOSIS — E1029 Type 1 diabetes mellitus with other diabetic kidney complication: Secondary | ICD-10-CM | POA: Diagnosis not present

## 2013-07-22 ENCOUNTER — Ambulatory Visit (INDEPENDENT_AMBULATORY_CARE_PROVIDER_SITE_OTHER): Payer: Medicare Other | Admitting: Ophthalmology

## 2013-07-22 DIAGNOSIS — E11319 Type 2 diabetes mellitus with unspecified diabetic retinopathy without macular edema: Secondary | ICD-10-CM

## 2013-07-22 DIAGNOSIS — H43819 Vitreous degeneration, unspecified eye: Secondary | ICD-10-CM

## 2013-07-22 DIAGNOSIS — I10 Essential (primary) hypertension: Secondary | ICD-10-CM | POA: Diagnosis not present

## 2013-07-22 DIAGNOSIS — E1165 Type 2 diabetes mellitus with hyperglycemia: Secondary | ICD-10-CM | POA: Diagnosis not present

## 2013-07-22 DIAGNOSIS — E1139 Type 2 diabetes mellitus with other diabetic ophthalmic complication: Secondary | ICD-10-CM

## 2013-07-22 DIAGNOSIS — H35039 Hypertensive retinopathy, unspecified eye: Secondary | ICD-10-CM

## 2013-07-22 DIAGNOSIS — H2510 Age-related nuclear cataract, unspecified eye: Secondary | ICD-10-CM

## 2013-07-27 DIAGNOSIS — M810 Age-related osteoporosis without current pathological fracture: Secondary | ICD-10-CM | POA: Diagnosis not present

## 2013-07-27 DIAGNOSIS — I10 Essential (primary) hypertension: Secondary | ICD-10-CM | POA: Diagnosis not present

## 2013-07-27 DIAGNOSIS — E1029 Type 1 diabetes mellitus with other diabetic kidney complication: Secondary | ICD-10-CM | POA: Diagnosis not present

## 2013-07-27 DIAGNOSIS — Z79899 Other long term (current) drug therapy: Secondary | ICD-10-CM | POA: Diagnosis not present

## 2013-07-27 DIAGNOSIS — M069 Rheumatoid arthritis, unspecified: Secondary | ICD-10-CM | POA: Diagnosis not present

## 2013-08-03 DIAGNOSIS — I10 Essential (primary) hypertension: Secondary | ICD-10-CM | POA: Diagnosis not present

## 2013-08-03 DIAGNOSIS — E7800 Pure hypercholesterolemia, unspecified: Secondary | ICD-10-CM | POA: Diagnosis not present

## 2013-08-03 DIAGNOSIS — E1029 Type 1 diabetes mellitus with other diabetic kidney complication: Secondary | ICD-10-CM | POA: Diagnosis not present

## 2013-08-19 DIAGNOSIS — Z794 Long term (current) use of insulin: Secondary | ICD-10-CM | POA: Diagnosis not present

## 2013-08-19 DIAGNOSIS — H2510 Age-related nuclear cataract, unspecified eye: Secondary | ICD-10-CM | POA: Diagnosis not present

## 2013-08-19 DIAGNOSIS — E119 Type 2 diabetes mellitus without complications: Secondary | ICD-10-CM | POA: Diagnosis not present

## 2013-08-25 DIAGNOSIS — D696 Thrombocytopenia, unspecified: Secondary | ICD-10-CM | POA: Diagnosis not present

## 2013-08-25 DIAGNOSIS — Z79899 Other long term (current) drug therapy: Secondary | ICD-10-CM | POA: Diagnosis not present

## 2013-08-25 DIAGNOSIS — M069 Rheumatoid arthritis, unspecified: Secondary | ICD-10-CM | POA: Diagnosis not present

## 2013-10-06 DIAGNOSIS — Z23 Encounter for immunization: Secondary | ICD-10-CM | POA: Diagnosis not present

## 2013-10-08 ENCOUNTER — Encounter: Payer: Medicare Other | Attending: Endocrinology | Admitting: *Deleted

## 2013-11-01 DIAGNOSIS — I10 Essential (primary) hypertension: Secondary | ICD-10-CM | POA: Diagnosis not present

## 2013-11-01 DIAGNOSIS — E1029 Type 1 diabetes mellitus with other diabetic kidney complication: Secondary | ICD-10-CM | POA: Diagnosis not present

## 2013-11-17 DIAGNOSIS — Z79899 Other long term (current) drug therapy: Secondary | ICD-10-CM | POA: Diagnosis not present

## 2013-11-17 DIAGNOSIS — M069 Rheumatoid arthritis, unspecified: Secondary | ICD-10-CM | POA: Diagnosis not present

## 2014-02-03 NOTE — Progress Notes (Signed)
Pump Upgrade Training: Appt start time: 0900 end time: 1100.   Assessment: Primary concerns today: Patient here for upgrade to [employer] after being on Animas pump previously  insulin pump.    Medications: see list. Insulin used in pump is Humalog  Intervention:  Pump settings transferred directly from current pump to new pump by patient New meter ID added to pump for communicating Reviewed new features of pump with patient Suggested use of Temp Basal features to patient in order to control BG with increased activity Patient  is not signed up on [employer], plan to do so at follow up visit   Patient expressed understanding of info taught and is wearing pump by end of visit.  Follow Up   Patient to see MD for insulin dose adjustments

## 2014-02-14 DIAGNOSIS — E1065 Type 1 diabetes mellitus with hyperglycemia: Secondary | ICD-10-CM | POA: Diagnosis not present

## 2014-02-14 DIAGNOSIS — E1029 Type 1 diabetes mellitus with other diabetic kidney complication: Secondary | ICD-10-CM | POA: Diagnosis not present

## 2014-02-23 DIAGNOSIS — D696 Thrombocytopenia, unspecified: Secondary | ICD-10-CM | POA: Diagnosis not present

## 2014-02-23 DIAGNOSIS — M069 Rheumatoid arthritis, unspecified: Secondary | ICD-10-CM | POA: Diagnosis not present

## 2014-02-23 DIAGNOSIS — M25579 Pain in unspecified ankle and joints of unspecified foot: Secondary | ICD-10-CM | POA: Diagnosis not present

## 2014-02-23 DIAGNOSIS — M722 Plantar fascial fibromatosis: Secondary | ICD-10-CM | POA: Diagnosis not present

## 2014-03-01 DIAGNOSIS — Z23 Encounter for immunization: Secondary | ICD-10-CM | POA: Diagnosis not present

## 2014-03-01 DIAGNOSIS — E1029 Type 1 diabetes mellitus with other diabetic kidney complication: Secondary | ICD-10-CM | POA: Diagnosis not present

## 2014-03-01 DIAGNOSIS — Z0000 Encounter for general adult medical examination without abnormal findings: Secondary | ICD-10-CM | POA: Diagnosis not present

## 2014-03-01 DIAGNOSIS — Z1331 Encounter for screening for depression: Secondary | ICD-10-CM | POA: Diagnosis not present

## 2014-03-01 DIAGNOSIS — M810 Age-related osteoporosis without current pathological fracture: Secondary | ICD-10-CM | POA: Diagnosis not present

## 2014-03-01 DIAGNOSIS — I10 Essential (primary) hypertension: Secondary | ICD-10-CM | POA: Diagnosis not present

## 2014-03-01 DIAGNOSIS — E7800 Pure hypercholesterolemia, unspecified: Secondary | ICD-10-CM | POA: Diagnosis not present

## 2014-03-01 DIAGNOSIS — E1065 Type 1 diabetes mellitus with hyperglycemia: Secondary | ICD-10-CM | POA: Diagnosis not present

## 2014-03-01 DIAGNOSIS — Z125 Encounter for screening for malignant neoplasm of prostate: Secondary | ICD-10-CM | POA: Diagnosis not present

## 2014-03-11 DIAGNOSIS — E1029 Type 1 diabetes mellitus with other diabetic kidney complication: Secondary | ICD-10-CM | POA: Diagnosis not present

## 2014-03-11 DIAGNOSIS — E7800 Pure hypercholesterolemia, unspecified: Secondary | ICD-10-CM | POA: Diagnosis not present

## 2014-03-11 DIAGNOSIS — I10 Essential (primary) hypertension: Secondary | ICD-10-CM | POA: Diagnosis not present

## 2014-03-11 DIAGNOSIS — N183 Chronic kidney disease, stage 3 unspecified: Secondary | ICD-10-CM | POA: Diagnosis not present

## 2014-03-11 DIAGNOSIS — E1065 Type 1 diabetes mellitus with hyperglycemia: Secondary | ICD-10-CM | POA: Diagnosis not present

## 2014-05-30 DIAGNOSIS — M069 Rheumatoid arthritis, unspecified: Secondary | ICD-10-CM | POA: Diagnosis not present

## 2014-05-30 DIAGNOSIS — Z79899 Other long term (current) drug therapy: Secondary | ICD-10-CM | POA: Diagnosis not present

## 2014-06-24 DIAGNOSIS — E1029 Type 1 diabetes mellitus with other diabetic kidney complication: Secondary | ICD-10-CM | POA: Diagnosis not present

## 2014-08-02 DIAGNOSIS — M25579 Pain in unspecified ankle and joints of unspecified foot: Secondary | ICD-10-CM | POA: Diagnosis not present

## 2014-08-02 DIAGNOSIS — M069 Rheumatoid arthritis, unspecified: Secondary | ICD-10-CM | POA: Diagnosis not present

## 2014-08-02 DIAGNOSIS — D696 Thrombocytopenia, unspecified: Secondary | ICD-10-CM | POA: Diagnosis not present

## 2014-08-31 ENCOUNTER — Ambulatory Visit (INDEPENDENT_AMBULATORY_CARE_PROVIDER_SITE_OTHER): Payer: Medicare Other | Admitting: Ophthalmology

## 2014-08-31 DIAGNOSIS — E1139 Type 2 diabetes mellitus with other diabetic ophthalmic complication: Secondary | ICD-10-CM | POA: Diagnosis not present

## 2014-08-31 DIAGNOSIS — E1165 Type 2 diabetes mellitus with hyperglycemia: Secondary | ICD-10-CM | POA: Diagnosis not present

## 2014-08-31 DIAGNOSIS — H35039 Hypertensive retinopathy, unspecified eye: Secondary | ICD-10-CM | POA: Diagnosis not present

## 2014-08-31 DIAGNOSIS — I10 Essential (primary) hypertension: Secondary | ICD-10-CM | POA: Diagnosis not present

## 2014-08-31 DIAGNOSIS — E11319 Type 2 diabetes mellitus with unspecified diabetic retinopathy without macular edema: Secondary | ICD-10-CM | POA: Diagnosis not present

## 2014-08-31 DIAGNOSIS — H43819 Vitreous degeneration, unspecified eye: Secondary | ICD-10-CM

## 2014-08-31 DIAGNOSIS — H2510 Age-related nuclear cataract, unspecified eye: Secondary | ICD-10-CM

## 2014-09-07 DIAGNOSIS — M810 Age-related osteoporosis without current pathological fracture: Secondary | ICD-10-CM | POA: Diagnosis not present

## 2014-09-07 DIAGNOSIS — I10 Essential (primary) hypertension: Secondary | ICD-10-CM | POA: Diagnosis not present

## 2014-09-07 DIAGNOSIS — E1029 Type 1 diabetes mellitus with other diabetic kidney complication: Secondary | ICD-10-CM | POA: Diagnosis not present

## 2014-09-14 DIAGNOSIS — I10 Essential (primary) hypertension: Secondary | ICD-10-CM | POA: Diagnosis not present

## 2014-09-14 DIAGNOSIS — N183 Chronic kidney disease, stage 3 unspecified: Secondary | ICD-10-CM | POA: Diagnosis not present

## 2014-09-14 DIAGNOSIS — E1029 Type 1 diabetes mellitus with other diabetic kidney complication: Secondary | ICD-10-CM | POA: Diagnosis not present

## 2014-09-14 DIAGNOSIS — E7800 Pure hypercholesterolemia, unspecified: Secondary | ICD-10-CM | POA: Diagnosis not present

## 2014-09-14 DIAGNOSIS — Z23 Encounter for immunization: Secondary | ICD-10-CM | POA: Diagnosis not present

## 2014-10-05 DIAGNOSIS — E109 Type 1 diabetes mellitus without complications: Secondary | ICD-10-CM | POA: Diagnosis not present

## 2014-10-10 DIAGNOSIS — Z800 Family history of malignant neoplasm of digestive organs: Secondary | ICD-10-CM | POA: Diagnosis not present

## 2014-10-10 DIAGNOSIS — Z09 Encounter for follow-up examination after completed treatment for conditions other than malignant neoplasm: Secondary | ICD-10-CM | POA: Diagnosis not present

## 2014-10-10 DIAGNOSIS — Z86010 Personal history of colonic polyps: Secondary | ICD-10-CM | POA: Diagnosis not present

## 2014-10-10 DIAGNOSIS — K5730 Diverticulosis of large intestine without perforation or abscess without bleeding: Secondary | ICD-10-CM | POA: Diagnosis not present

## 2014-10-24 DIAGNOSIS — E119 Type 2 diabetes mellitus without complications: Secondary | ICD-10-CM | POA: Diagnosis not present

## 2014-10-24 DIAGNOSIS — H25013 Cortical age-related cataract, bilateral: Secondary | ICD-10-CM | POA: Diagnosis not present

## 2014-10-24 DIAGNOSIS — Z794 Long term (current) use of insulin: Secondary | ICD-10-CM | POA: Diagnosis not present

## 2014-10-24 DIAGNOSIS — H2513 Age-related nuclear cataract, bilateral: Secondary | ICD-10-CM | POA: Diagnosis not present

## 2014-11-01 DIAGNOSIS — M25672 Stiffness of left ankle, not elsewhere classified: Secondary | ICD-10-CM | POA: Diagnosis not present

## 2014-11-01 DIAGNOSIS — Z7952 Long term (current) use of systemic steroids: Secondary | ICD-10-CM | POA: Diagnosis not present

## 2014-11-01 DIAGNOSIS — M069 Rheumatoid arthritis, unspecified: Secondary | ICD-10-CM | POA: Diagnosis not present

## 2014-11-01 DIAGNOSIS — E119 Type 2 diabetes mellitus without complications: Secondary | ICD-10-CM | POA: Diagnosis not present

## 2014-11-01 DIAGNOSIS — M25611 Stiffness of right shoulder, not elsewhere classified: Secondary | ICD-10-CM | POA: Diagnosis not present

## 2014-12-23 DIAGNOSIS — M069 Rheumatoid arthritis, unspecified: Secondary | ICD-10-CM | POA: Diagnosis not present

## 2014-12-23 DIAGNOSIS — Z7952 Long term (current) use of systemic steroids: Secondary | ICD-10-CM | POA: Diagnosis not present

## 2015-01-05 DIAGNOSIS — E109 Type 1 diabetes mellitus without complications: Secondary | ICD-10-CM | POA: Diagnosis not present

## 2015-03-03 DIAGNOSIS — R40242 Glasgow coma scale score 9-12: Secondary | ICD-10-CM | POA: Diagnosis not present

## 2015-03-15 DIAGNOSIS — M810 Age-related osteoporosis without current pathological fracture: Secondary | ICD-10-CM | POA: Diagnosis not present

## 2015-03-15 DIAGNOSIS — E1021 Type 1 diabetes mellitus with diabetic nephropathy: Secondary | ICD-10-CM | POA: Diagnosis not present

## 2015-03-15 DIAGNOSIS — Z0000 Encounter for general adult medical examination without abnormal findings: Secondary | ICD-10-CM | POA: Diagnosis not present

## 2015-03-15 DIAGNOSIS — I10 Essential (primary) hypertension: Secondary | ICD-10-CM | POA: Diagnosis not present

## 2015-03-15 DIAGNOSIS — Z1389 Encounter for screening for other disorder: Secondary | ICD-10-CM | POA: Diagnosis not present

## 2015-03-15 DIAGNOSIS — Z125 Encounter for screening for malignant neoplasm of prostate: Secondary | ICD-10-CM | POA: Diagnosis not present

## 2015-03-15 DIAGNOSIS — E663 Overweight: Secondary | ICD-10-CM | POA: Diagnosis not present

## 2015-03-22 ENCOUNTER — Other Ambulatory Visit: Payer: Self-pay | Admitting: Internal Medicine

## 2015-03-22 DIAGNOSIS — R740 Nonspecific elevation of levels of transaminase and lactic acid dehydrogenase [LDH]: Principal | ICD-10-CM

## 2015-03-22 DIAGNOSIS — I10 Essential (primary) hypertension: Secondary | ICD-10-CM | POA: Diagnosis not present

## 2015-03-22 DIAGNOSIS — M810 Age-related osteoporosis without current pathological fracture: Secondary | ICD-10-CM | POA: Diagnosis not present

## 2015-03-22 DIAGNOSIS — E780 Pure hypercholesterolemia: Secondary | ICD-10-CM | POA: Diagnosis not present

## 2015-03-22 DIAGNOSIS — IMO0002 Reserved for concepts with insufficient information to code with codable children: Secondary | ICD-10-CM

## 2015-03-22 DIAGNOSIS — E1021 Type 1 diabetes mellitus with diabetic nephropathy: Secondary | ICD-10-CM | POA: Diagnosis not present

## 2015-03-22 DIAGNOSIS — N182 Chronic kidney disease, stage 2 (mild): Secondary | ICD-10-CM | POA: Diagnosis not present

## 2015-03-30 ENCOUNTER — Ambulatory Visit
Admission: RE | Admit: 2015-03-30 | Discharge: 2015-03-30 | Disposition: A | Discharge status: Home / Self Care | Payer: Medicare Other | Source: Ambulatory Visit | Attending: Internal Medicine | Admitting: Internal Medicine

## 2015-03-30 DIAGNOSIS — R740 Nonspecific elevation of levels of transaminase and lactic acid dehydrogenase [LDH]: Principal | ICD-10-CM

## 2015-03-30 DIAGNOSIS — D1803 Hemangioma of intra-abdominal structures: Secondary | ICD-10-CM | POA: Diagnosis not present

## 2015-03-30 DIAGNOSIS — IMO0002 Reserved for concepts with insufficient information to code with codable children: Secondary | ICD-10-CM

## 2015-04-03 DIAGNOSIS — M25572 Pain in left ankle and joints of left foot: Secondary | ICD-10-CM | POA: Diagnosis not present

## 2015-04-03 DIAGNOSIS — E119 Type 2 diabetes mellitus without complications: Secondary | ICD-10-CM | POA: Diagnosis not present

## 2015-04-03 DIAGNOSIS — Z79899 Other long term (current) drug therapy: Secondary | ICD-10-CM | POA: Diagnosis not present

## 2015-04-03 DIAGNOSIS — R945 Abnormal results of liver function studies: Secondary | ICD-10-CM | POA: Diagnosis not present

## 2015-04-03 DIAGNOSIS — M059 Rheumatoid arthritis with rheumatoid factor, unspecified: Secondary | ICD-10-CM | POA: Diagnosis not present

## 2015-04-03 DIAGNOSIS — D696 Thrombocytopenia, unspecified: Secondary | ICD-10-CM | POA: Diagnosis not present

## 2015-04-05 DIAGNOSIS — E162 Hypoglycemia, unspecified: Secondary | ICD-10-CM | POA: Diagnosis not present

## 2015-04-05 DIAGNOSIS — E1021 Type 1 diabetes mellitus with diabetic nephropathy: Secondary | ICD-10-CM | POA: Diagnosis not present

## 2015-04-13 DIAGNOSIS — E1021 Type 1 diabetes mellitus with diabetic nephropathy: Secondary | ICD-10-CM | POA: Diagnosis not present

## 2015-05-11 DIAGNOSIS — M069 Rheumatoid arthritis, unspecified: Secondary | ICD-10-CM | POA: Diagnosis not present

## 2015-05-11 DIAGNOSIS — Z79899 Other long term (current) drug therapy: Secondary | ICD-10-CM | POA: Diagnosis not present

## 2015-08-28 DIAGNOSIS — I10 Essential (primary) hypertension: Secondary | ICD-10-CM | POA: Diagnosis not present

## 2015-08-28 DIAGNOSIS — E1021 Type 1 diabetes mellitus with diabetic nephropathy: Secondary | ICD-10-CM | POA: Diagnosis not present

## 2015-08-28 DIAGNOSIS — Z23 Encounter for immunization: Secondary | ICD-10-CM | POA: Diagnosis not present

## 2015-08-28 DIAGNOSIS — E785 Hyperlipidemia, unspecified: Secondary | ICD-10-CM | POA: Diagnosis not present

## 2015-08-28 DIAGNOSIS — N182 Chronic kidney disease, stage 2 (mild): Secondary | ICD-10-CM | POA: Diagnosis not present

## 2015-09-06 DIAGNOSIS — M25442 Effusion, left hand: Secondary | ICD-10-CM | POA: Diagnosis not present

## 2015-09-06 DIAGNOSIS — Z79899 Other long term (current) drug therapy: Secondary | ICD-10-CM | POA: Diagnosis not present

## 2015-09-06 DIAGNOSIS — D696 Thrombocytopenia, unspecified: Secondary | ICD-10-CM | POA: Diagnosis not present

## 2015-09-06 DIAGNOSIS — M25441 Effusion, right hand: Secondary | ICD-10-CM | POA: Diagnosis not present

## 2015-09-06 DIAGNOSIS — M0570 Rheumatoid arthritis with rheumatoid factor of unspecified site without organ or systems involvement: Secondary | ICD-10-CM | POA: Diagnosis not present

## 2015-09-06 DIAGNOSIS — M7989 Other specified soft tissue disorders: Secondary | ICD-10-CM | POA: Diagnosis not present

## 2015-09-08 ENCOUNTER — Ambulatory Visit (INDEPENDENT_AMBULATORY_CARE_PROVIDER_SITE_OTHER): Payer: Medicare Other | Admitting: Ophthalmology

## 2015-09-08 DIAGNOSIS — E11319 Type 2 diabetes mellitus with unspecified diabetic retinopathy without macular edema: Secondary | ICD-10-CM | POA: Diagnosis not present

## 2015-09-08 DIAGNOSIS — H43813 Vitreous degeneration, bilateral: Secondary | ICD-10-CM

## 2015-09-08 DIAGNOSIS — E11329 Type 2 diabetes mellitus with mild nonproliferative diabetic retinopathy without macular edema: Secondary | ICD-10-CM

## 2015-09-08 DIAGNOSIS — I10 Essential (primary) hypertension: Secondary | ICD-10-CM | POA: Diagnosis not present

## 2015-09-08 DIAGNOSIS — H35033 Hypertensive retinopathy, bilateral: Secondary | ICD-10-CM | POA: Diagnosis not present

## 2015-09-25 DIAGNOSIS — M810 Age-related osteoporosis without current pathological fracture: Secondary | ICD-10-CM | POA: Diagnosis not present

## 2015-09-25 DIAGNOSIS — I10 Essential (primary) hypertension: Secondary | ICD-10-CM | POA: Diagnosis not present

## 2015-10-02 DIAGNOSIS — N182 Chronic kidney disease, stage 2 (mild): Secondary | ICD-10-CM | POA: Diagnosis not present

## 2015-10-02 DIAGNOSIS — D709 Neutropenia, unspecified: Secondary | ICD-10-CM | POA: Diagnosis not present

## 2015-10-02 DIAGNOSIS — E1021 Type 1 diabetes mellitus with diabetic nephropathy: Secondary | ICD-10-CM | POA: Diagnosis not present

## 2015-10-02 DIAGNOSIS — I129 Hypertensive chronic kidney disease with stage 1 through stage 4 chronic kidney disease, or unspecified chronic kidney disease: Secondary | ICD-10-CM | POA: Diagnosis not present

## 2015-10-02 DIAGNOSIS — F17211 Nicotine dependence, cigarettes, in remission: Secondary | ICD-10-CM | POA: Diagnosis not present

## 2015-11-27 DIAGNOSIS — H2513 Age-related nuclear cataract, bilateral: Secondary | ICD-10-CM | POA: Diagnosis not present

## 2015-11-27 DIAGNOSIS — E119 Type 2 diabetes mellitus without complications: Secondary | ICD-10-CM | POA: Diagnosis not present

## 2015-11-27 DIAGNOSIS — Z794 Long term (current) use of insulin: Secondary | ICD-10-CM | POA: Diagnosis not present

## 2015-12-04 DIAGNOSIS — I129 Hypertensive chronic kidney disease with stage 1 through stage 4 chronic kidney disease, or unspecified chronic kidney disease: Secondary | ICD-10-CM | POA: Diagnosis not present

## 2015-12-04 DIAGNOSIS — E1065 Type 1 diabetes mellitus with hyperglycemia: Secondary | ICD-10-CM | POA: Diagnosis not present

## 2015-12-04 DIAGNOSIS — E785 Hyperlipidemia, unspecified: Secondary | ICD-10-CM | POA: Diagnosis not present

## 2015-12-04 DIAGNOSIS — E1165 Type 2 diabetes mellitus with hyperglycemia: Secondary | ICD-10-CM | POA: Diagnosis not present

## 2015-12-27 DIAGNOSIS — M0570 Rheumatoid arthritis with rheumatoid factor of unspecified site without organ or systems involvement: Secondary | ICD-10-CM | POA: Diagnosis not present

## 2015-12-27 DIAGNOSIS — Z79899 Other long term (current) drug therapy: Secondary | ICD-10-CM | POA: Diagnosis not present

## 2016-02-07 DIAGNOSIS — M79641 Pain in right hand: Secondary | ICD-10-CM | POA: Diagnosis not present

## 2016-02-07 DIAGNOSIS — Z79899 Other long term (current) drug therapy: Secondary | ICD-10-CM | POA: Diagnosis not present

## 2016-02-07 DIAGNOSIS — E119 Type 2 diabetes mellitus without complications: Secondary | ICD-10-CM | POA: Diagnosis not present

## 2016-02-07 DIAGNOSIS — M0570 Rheumatoid arthritis with rheumatoid factor of unspecified site without organ or systems involvement: Secondary | ICD-10-CM | POA: Diagnosis not present

## 2016-02-07 DIAGNOSIS — D72819 Decreased white blood cell count, unspecified: Secondary | ICD-10-CM | POA: Diagnosis not present

## 2016-02-07 DIAGNOSIS — M79642 Pain in left hand: Secondary | ICD-10-CM | POA: Diagnosis not present

## 2016-02-26 DIAGNOSIS — I10 Essential (primary) hypertension: Secondary | ICD-10-CM | POA: Diagnosis not present

## 2016-02-26 DIAGNOSIS — E1065 Type 1 diabetes mellitus with hyperglycemia: Secondary | ICD-10-CM | POA: Diagnosis not present

## 2016-02-26 DIAGNOSIS — E785 Hyperlipidemia, unspecified: Secondary | ICD-10-CM | POA: Diagnosis not present

## 2016-02-26 DIAGNOSIS — E1022 Type 1 diabetes mellitus with diabetic chronic kidney disease: Secondary | ICD-10-CM | POA: Diagnosis not present

## 2016-04-01 DIAGNOSIS — Z1389 Encounter for screening for other disorder: Secondary | ICD-10-CM | POA: Diagnosis not present

## 2016-04-01 DIAGNOSIS — Z23 Encounter for immunization: Secondary | ICD-10-CM | POA: Diagnosis not present

## 2016-04-01 DIAGNOSIS — Z0000 Encounter for general adult medical examination without abnormal findings: Secondary | ICD-10-CM | POA: Diagnosis not present

## 2016-04-01 DIAGNOSIS — M810 Age-related osteoporosis without current pathological fracture: Secondary | ICD-10-CM | POA: Diagnosis not present

## 2016-04-01 DIAGNOSIS — I129 Hypertensive chronic kidney disease with stage 1 through stage 4 chronic kidney disease, or unspecified chronic kidney disease: Secondary | ICD-10-CM | POA: Diagnosis not present

## 2016-04-01 DIAGNOSIS — E559 Vitamin D deficiency, unspecified: Secondary | ICD-10-CM | POA: Diagnosis not present

## 2016-04-01 DIAGNOSIS — Z125 Encounter for screening for malignant neoplasm of prostate: Secondary | ICD-10-CM | POA: Diagnosis not present

## 2016-04-01 DIAGNOSIS — E1065 Type 1 diabetes mellitus with hyperglycemia: Secondary | ICD-10-CM | POA: Diagnosis not present

## 2016-04-01 DIAGNOSIS — E1021 Type 1 diabetes mellitus with diabetic nephropathy: Secondary | ICD-10-CM | POA: Diagnosis not present

## 2016-04-05 DIAGNOSIS — E1065 Type 1 diabetes mellitus with hyperglycemia: Secondary | ICD-10-CM | POA: Diagnosis not present

## 2016-04-08 DIAGNOSIS — N182 Chronic kidney disease, stage 2 (mild): Secondary | ICD-10-CM | POA: Diagnosis not present

## 2016-04-08 DIAGNOSIS — E785 Hyperlipidemia, unspecified: Secondary | ICD-10-CM | POA: Diagnosis not present

## 2016-04-08 DIAGNOSIS — D709 Neutropenia, unspecified: Secondary | ICD-10-CM | POA: Diagnosis not present

## 2016-04-08 DIAGNOSIS — F17211 Nicotine dependence, cigarettes, in remission: Secondary | ICD-10-CM | POA: Diagnosis not present

## 2016-04-08 DIAGNOSIS — I129 Hypertensive chronic kidney disease with stage 1 through stage 4 chronic kidney disease, or unspecified chronic kidney disease: Secondary | ICD-10-CM | POA: Diagnosis not present

## 2016-04-08 DIAGNOSIS — E1022 Type 1 diabetes mellitus with diabetic chronic kidney disease: Secondary | ICD-10-CM | POA: Diagnosis not present

## 2016-04-08 DIAGNOSIS — E875 Hyperkalemia: Secondary | ICD-10-CM | POA: Diagnosis not present

## 2016-05-07 DIAGNOSIS — E1065 Type 1 diabetes mellitus with hyperglycemia: Secondary | ICD-10-CM | POA: Diagnosis not present

## 2016-06-27 DIAGNOSIS — E108 Type 1 diabetes mellitus with unspecified complications: Secondary | ICD-10-CM | POA: Diagnosis not present

## 2016-06-27 DIAGNOSIS — D696 Thrombocytopenia, unspecified: Secondary | ICD-10-CM | POA: Diagnosis not present

## 2016-06-27 DIAGNOSIS — M0570 Rheumatoid arthritis with rheumatoid factor of unspecified site without organ or systems involvement: Secondary | ICD-10-CM | POA: Diagnosis not present

## 2016-06-27 DIAGNOSIS — Z79899 Other long term (current) drug therapy: Secondary | ICD-10-CM | POA: Diagnosis not present

## 2016-08-12 ENCOUNTER — Other Ambulatory Visit: Payer: Self-pay

## 2016-08-28 DIAGNOSIS — I129 Hypertensive chronic kidney disease with stage 1 through stage 4 chronic kidney disease, or unspecified chronic kidney disease: Secondary | ICD-10-CM | POA: Diagnosis not present

## 2016-08-28 DIAGNOSIS — E1065 Type 1 diabetes mellitus with hyperglycemia: Secondary | ICD-10-CM | POA: Diagnosis not present

## 2016-08-28 DIAGNOSIS — E785 Hyperlipidemia, unspecified: Secondary | ICD-10-CM | POA: Diagnosis not present

## 2016-09-09 ENCOUNTER — Ambulatory Visit (INDEPENDENT_AMBULATORY_CARE_PROVIDER_SITE_OTHER): Payer: Medicare Other | Admitting: Ophthalmology

## 2016-09-09 DIAGNOSIS — E113293 Type 2 diabetes mellitus with mild nonproliferative diabetic retinopathy without macular edema, bilateral: Secondary | ICD-10-CM | POA: Diagnosis not present

## 2016-09-09 DIAGNOSIS — H43813 Vitreous degeneration, bilateral: Secondary | ICD-10-CM

## 2016-09-09 DIAGNOSIS — H35033 Hypertensive retinopathy, bilateral: Secondary | ICD-10-CM

## 2016-09-09 DIAGNOSIS — I10 Essential (primary) hypertension: Secondary | ICD-10-CM

## 2016-09-09 DIAGNOSIS — E11319 Type 2 diabetes mellitus with unspecified diabetic retinopathy without macular edema: Secondary | ICD-10-CM | POA: Diagnosis not present

## 2016-09-09 DIAGNOSIS — H2513 Age-related nuclear cataract, bilateral: Secondary | ICD-10-CM | POA: Diagnosis not present

## 2016-09-30 DIAGNOSIS — M0570 Rheumatoid arthritis with rheumatoid factor of unspecified site without organ or systems involvement: Secondary | ICD-10-CM | POA: Diagnosis not present

## 2016-09-30 DIAGNOSIS — Z79899 Other long term (current) drug therapy: Secondary | ICD-10-CM | POA: Diagnosis not present

## 2016-10-08 DIAGNOSIS — M810 Age-related osteoporosis without current pathological fracture: Secondary | ICD-10-CM | POA: Diagnosis not present

## 2016-10-08 DIAGNOSIS — E1021 Type 1 diabetes mellitus with diabetic nephropathy: Secondary | ICD-10-CM | POA: Diagnosis not present

## 2016-10-08 DIAGNOSIS — E1022 Type 1 diabetes mellitus with diabetic chronic kidney disease: Secondary | ICD-10-CM | POA: Diagnosis not present

## 2016-10-08 DIAGNOSIS — E1065 Type 1 diabetes mellitus with hyperglycemia: Secondary | ICD-10-CM | POA: Diagnosis not present

## 2016-10-08 DIAGNOSIS — I129 Hypertensive chronic kidney disease with stage 1 through stage 4 chronic kidney disease, or unspecified chronic kidney disease: Secondary | ICD-10-CM | POA: Diagnosis not present

## 2016-10-08 DIAGNOSIS — I10 Essential (primary) hypertension: Secondary | ICD-10-CM | POA: Diagnosis not present

## 2016-10-15 DIAGNOSIS — I129 Hypertensive chronic kidney disease with stage 1 through stage 4 chronic kidney disease, or unspecified chronic kidney disease: Secondary | ICD-10-CM | POA: Diagnosis not present

## 2016-10-15 DIAGNOSIS — E785 Hyperlipidemia, unspecified: Secondary | ICD-10-CM | POA: Diagnosis not present

## 2016-10-15 DIAGNOSIS — N182 Chronic kidney disease, stage 2 (mild): Secondary | ICD-10-CM | POA: Diagnosis not present

## 2016-10-15 DIAGNOSIS — E1065 Type 1 diabetes mellitus with hyperglycemia: Secondary | ICD-10-CM | POA: Diagnosis not present

## 2016-11-14 DIAGNOSIS — H2513 Age-related nuclear cataract, bilateral: Secondary | ICD-10-CM | POA: Diagnosis not present

## 2016-11-14 DIAGNOSIS — H5213 Myopia, bilateral: Secondary | ICD-10-CM | POA: Diagnosis not present

## 2016-11-14 DIAGNOSIS — E119 Type 2 diabetes mellitus without complications: Secondary | ICD-10-CM | POA: Diagnosis not present

## 2016-11-14 DIAGNOSIS — H524 Presbyopia: Secondary | ICD-10-CM | POA: Diagnosis not present

## 2016-11-14 DIAGNOSIS — Z794 Long term (current) use of insulin: Secondary | ICD-10-CM | POA: Diagnosis not present

## 2016-11-14 DIAGNOSIS — H52203 Unspecified astigmatism, bilateral: Secondary | ICD-10-CM | POA: Diagnosis not present

## 2016-12-05 DIAGNOSIS — E108 Type 1 diabetes mellitus with unspecified complications: Secondary | ICD-10-CM | POA: Diagnosis not present

## 2016-12-05 DIAGNOSIS — Z79899 Other long term (current) drug therapy: Secondary | ICD-10-CM | POA: Diagnosis not present

## 2016-12-05 DIAGNOSIS — M79641 Pain in right hand: Secondary | ICD-10-CM | POA: Diagnosis not present

## 2016-12-05 DIAGNOSIS — M79642 Pain in left hand: Secondary | ICD-10-CM | POA: Diagnosis not present

## 2016-12-05 DIAGNOSIS — M0570 Rheumatoid arthritis with rheumatoid factor of unspecified site without organ or systems involvement: Secondary | ICD-10-CM | POA: Diagnosis not present

## 2016-12-30 DIAGNOSIS — I129 Hypertensive chronic kidney disease with stage 1 through stage 4 chronic kidney disease, or unspecified chronic kidney disease: Secondary | ICD-10-CM | POA: Diagnosis not present

## 2016-12-30 DIAGNOSIS — E1065 Type 1 diabetes mellitus with hyperglycemia: Secondary | ICD-10-CM | POA: Diagnosis not present

## 2017-01-16 DIAGNOSIS — Z79899 Other long term (current) drug therapy: Secondary | ICD-10-CM | POA: Diagnosis not present

## 2017-01-16 DIAGNOSIS — M0570 Rheumatoid arthritis with rheumatoid factor of unspecified site without organ or systems involvement: Secondary | ICD-10-CM | POA: Diagnosis not present

## 2017-01-30 DIAGNOSIS — E1022 Type 1 diabetes mellitus with diabetic chronic kidney disease: Secondary | ICD-10-CM | POA: Diagnosis not present

## 2017-01-30 DIAGNOSIS — E1065 Type 1 diabetes mellitus with hyperglycemia: Secondary | ICD-10-CM | POA: Diagnosis not present

## 2017-02-20 IMAGING — US US ABDOMEN LIMITED
1 series · 14 of 25 positions shown · non-contrast
Comparison: Abdominal CT 08/13/2007 and ultrasound 06/30/2007

CLINICAL DATA: Elevated liver enzymes.

EXAM:
US ABDOMEN LIMITED - RIGHT UPPER QUADRANT

[Series 1: us abdomen limited · 0.18mm/px · 14 of 44 slices shown]
[im 1/44]
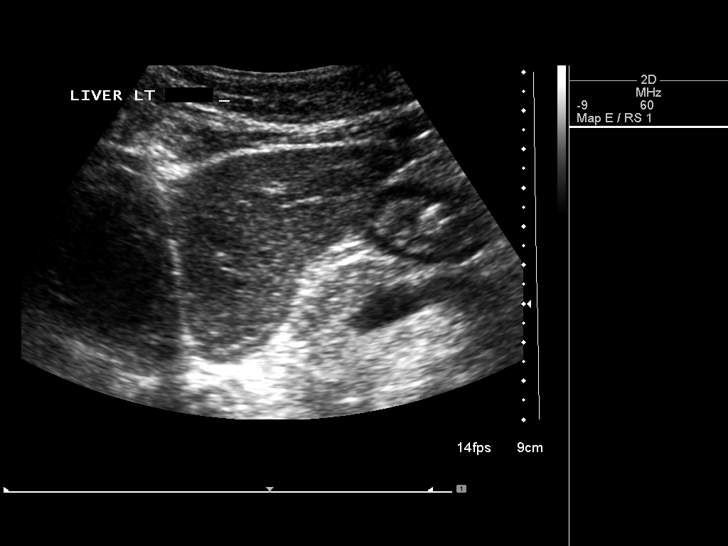
[im 4/44]
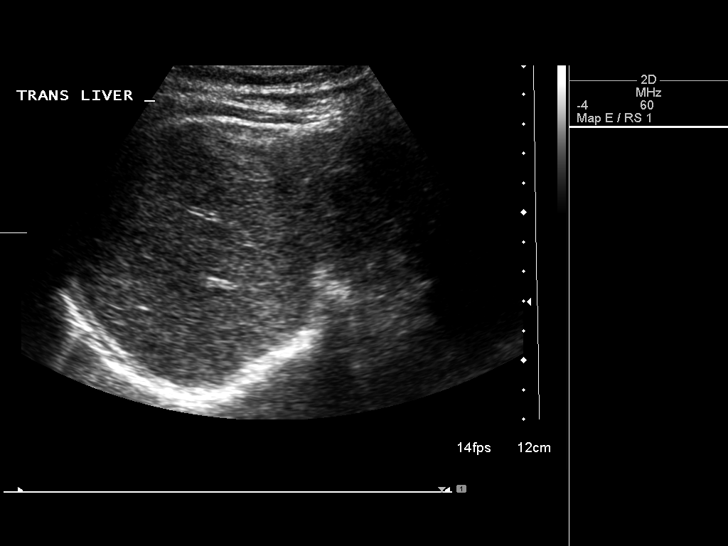
[im 8/44]
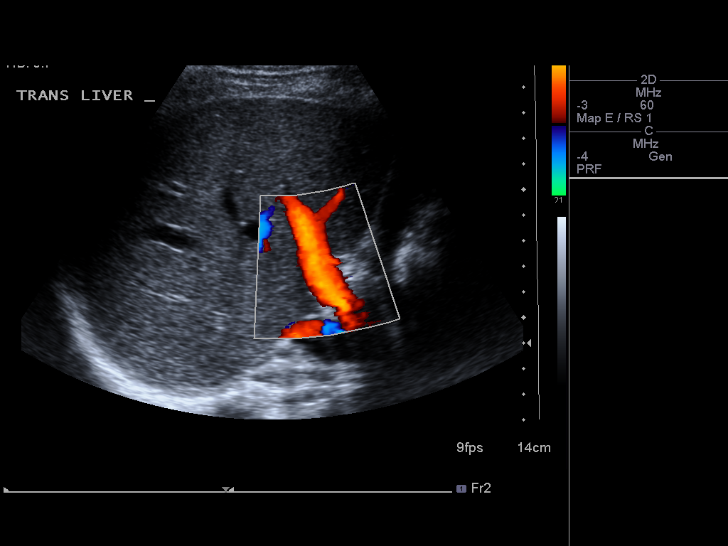
[im 11/44]
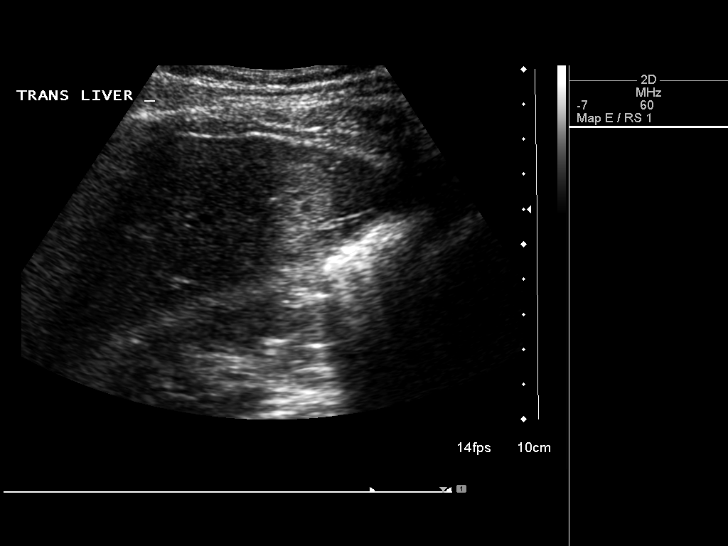
[im 15/44]
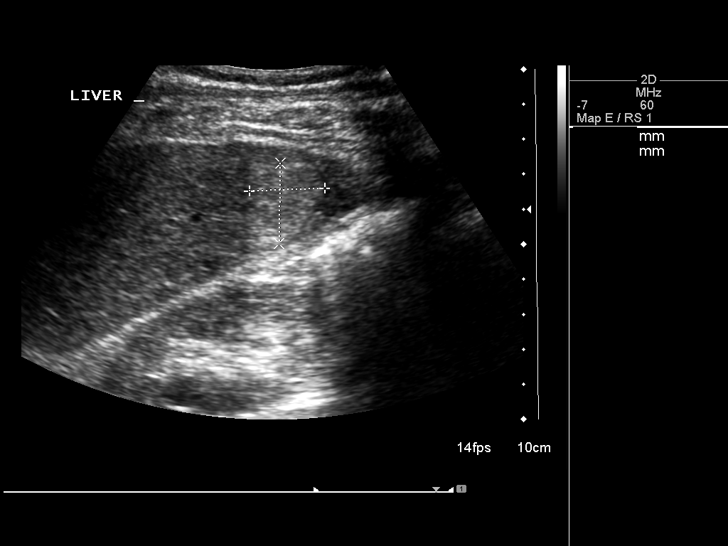
[im 17/44]
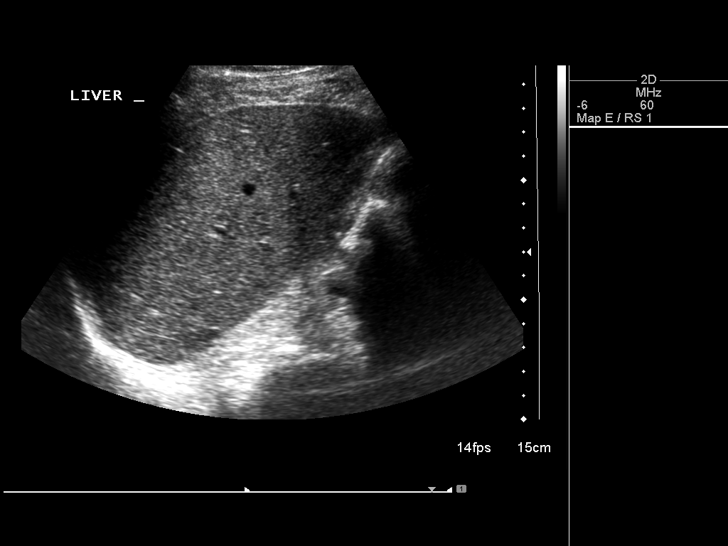
[im 20/44]
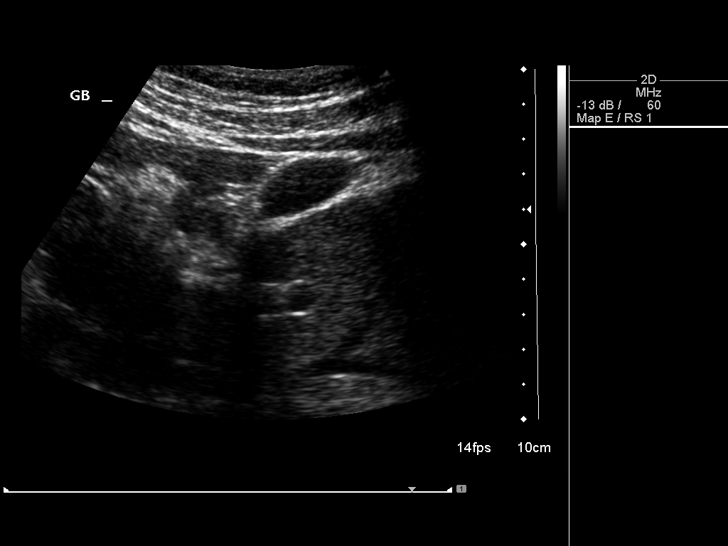
[im 24/44]
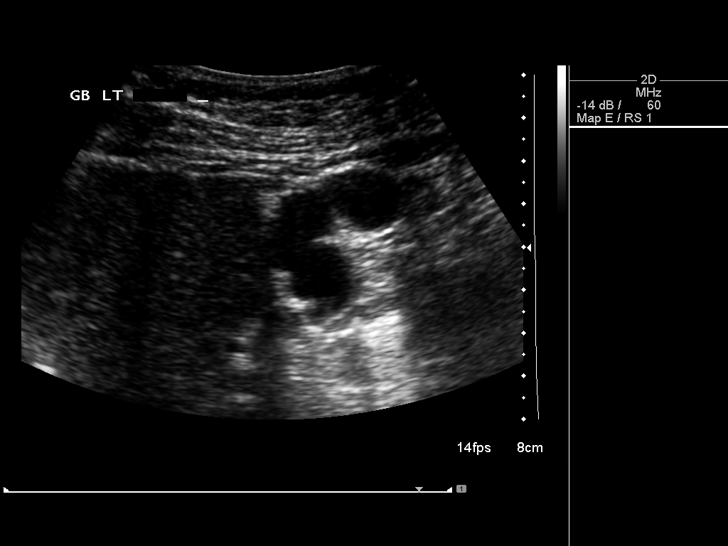
[im 27/44]
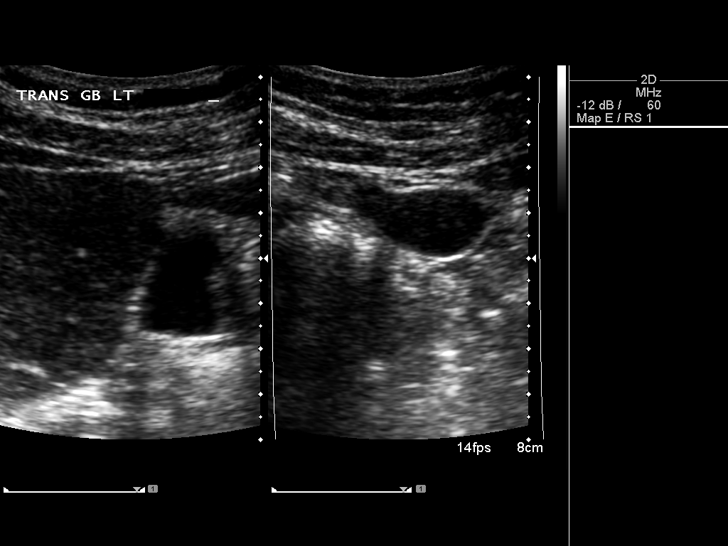
[im 29/44]
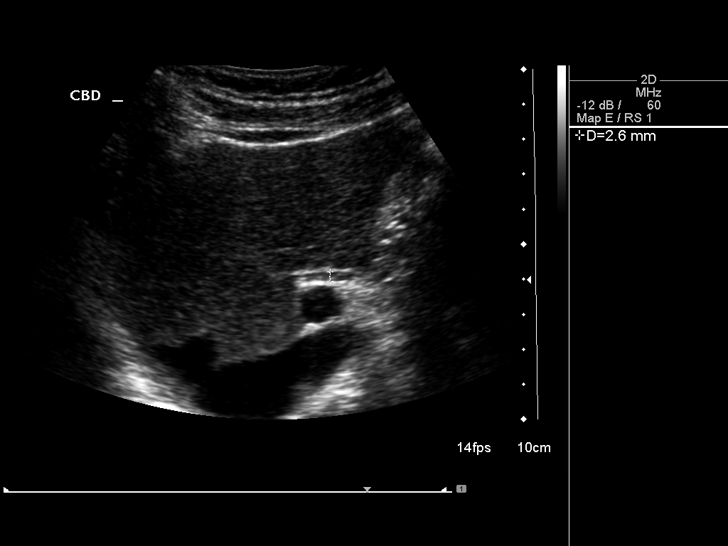
[im 33/44]
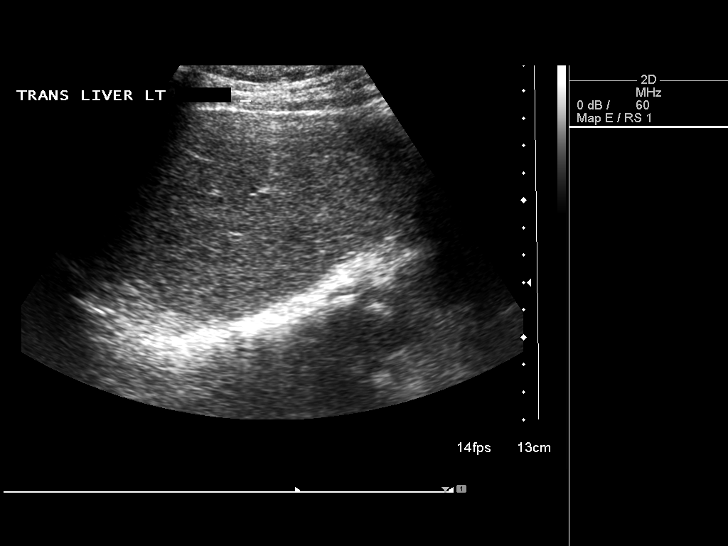
[im 36/44]
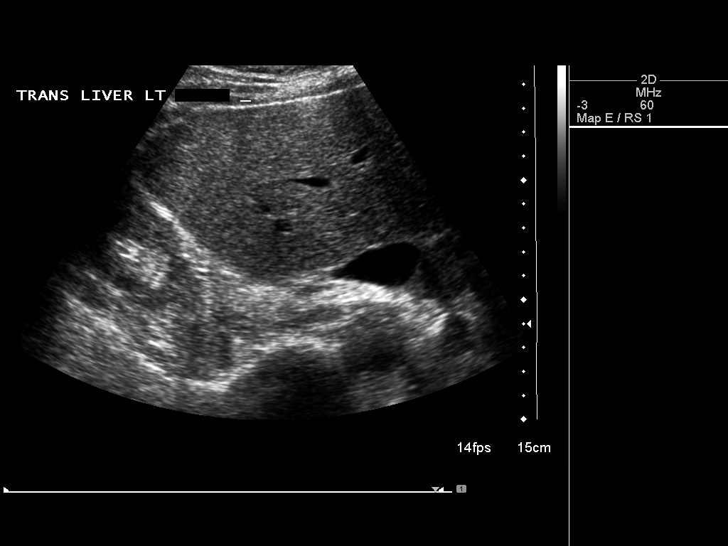
[im 40/44]
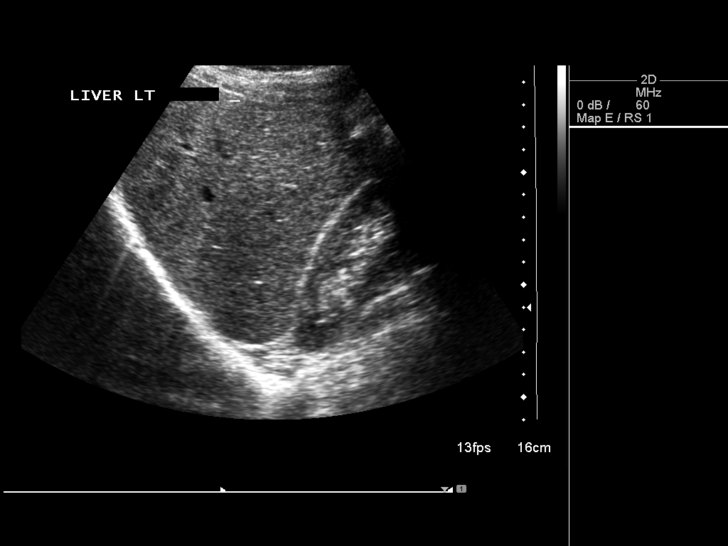
[im 44/44]
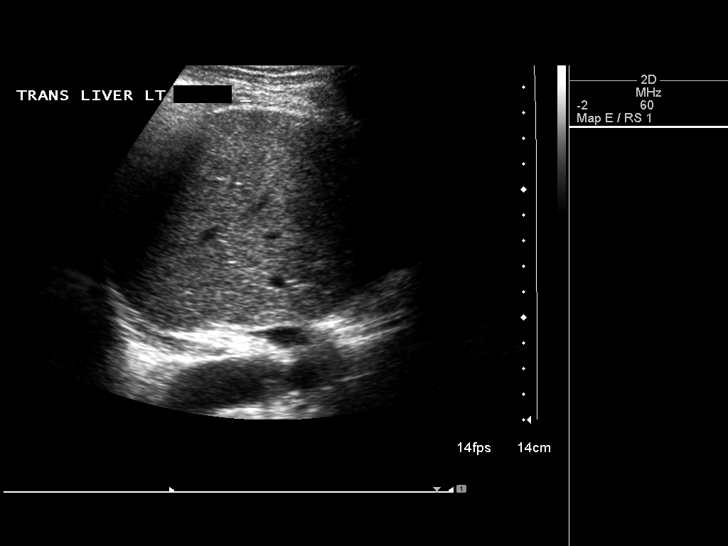

[14 of 25 positions shown; findings below may reference images not displayed]

FINDINGS: Gallbladder:

No gallstones or wall thickening visualized. No sonographic Murphy
sign noted.

Common bile duct:

Diameter: 3 mm

Liver:

2.2 x 2.5 x 1.8 cm hyperechoic lesion in the inferior right hepatic
lobe corresponds to the hemangioma demonstrated on prior CT and is
stable to slightly increased in size to the 2 prior studies.
IMPRESSION: 1. Right hepatic lobe hemangioma, otherwise unremarkable appearance
of the liver.
2. Unremarkable appearance of the gallbladder. No biliary
dilatation.

## 2017-04-03 DIAGNOSIS — Z79899 Other long term (current) drug therapy: Secondary | ICD-10-CM | POA: Diagnosis not present

## 2017-04-03 DIAGNOSIS — M0570 Rheumatoid arthritis with rheumatoid factor of unspecified site without organ or systems involvement: Secondary | ICD-10-CM | POA: Diagnosis not present

## 2017-04-03 DIAGNOSIS — D696 Thrombocytopenia, unspecified: Secondary | ICD-10-CM | POA: Diagnosis not present

## 2017-04-09 DIAGNOSIS — Z125 Encounter for screening for malignant neoplasm of prostate: Secondary | ICD-10-CM | POA: Diagnosis not present

## 2017-04-09 DIAGNOSIS — E1022 Type 1 diabetes mellitus with diabetic chronic kidney disease: Secondary | ICD-10-CM | POA: Diagnosis not present

## 2017-04-09 DIAGNOSIS — E785 Hyperlipidemia, unspecified: Secondary | ICD-10-CM | POA: Diagnosis not present

## 2017-04-09 DIAGNOSIS — I10 Essential (primary) hypertension: Secondary | ICD-10-CM | POA: Diagnosis not present

## 2017-04-09 DIAGNOSIS — E559 Vitamin D deficiency, unspecified: Secondary | ICD-10-CM | POA: Diagnosis not present

## 2017-04-16 DIAGNOSIS — N182 Chronic kidney disease, stage 2 (mild): Secondary | ICD-10-CM | POA: Diagnosis not present

## 2017-04-16 DIAGNOSIS — I129 Hypertensive chronic kidney disease with stage 1 through stage 4 chronic kidney disease, or unspecified chronic kidney disease: Secondary | ICD-10-CM | POA: Diagnosis not present

## 2017-04-16 DIAGNOSIS — D709 Neutropenia, unspecified: Secondary | ICD-10-CM | POA: Diagnosis not present

## 2017-04-16 DIAGNOSIS — E1065 Type 1 diabetes mellitus with hyperglycemia: Secondary | ICD-10-CM | POA: Diagnosis not present

## 2017-04-29 DIAGNOSIS — E1065 Type 1 diabetes mellitus with hyperglycemia: Secondary | ICD-10-CM | POA: Diagnosis not present

## 2017-04-29 DIAGNOSIS — I129 Hypertensive chronic kidney disease with stage 1 through stage 4 chronic kidney disease, or unspecified chronic kidney disease: Secondary | ICD-10-CM | POA: Diagnosis not present

## 2017-04-29 DIAGNOSIS — E785 Hyperlipidemia, unspecified: Secondary | ICD-10-CM | POA: Diagnosis not present

## 2017-04-29 DIAGNOSIS — E1022 Type 1 diabetes mellitus with diabetic chronic kidney disease: Secondary | ICD-10-CM | POA: Diagnosis not present

## 2017-07-09 DIAGNOSIS — M1990 Unspecified osteoarthritis, unspecified site: Secondary | ICD-10-CM | POA: Diagnosis not present

## 2017-07-09 DIAGNOSIS — M12841 Other specific arthropathies, not elsewhere classified, right hand: Secondary | ICD-10-CM | POA: Diagnosis not present

## 2017-07-09 DIAGNOSIS — M12842 Other specific arthropathies, not elsewhere classified, left hand: Secondary | ICD-10-CM | POA: Diagnosis not present

## 2017-07-09 DIAGNOSIS — D696 Thrombocytopenia, unspecified: Secondary | ICD-10-CM | POA: Diagnosis not present

## 2017-07-09 DIAGNOSIS — Z79899 Other long term (current) drug therapy: Secondary | ICD-10-CM | POA: Diagnosis not present

## 2017-07-09 DIAGNOSIS — M0579 Rheumatoid arthritis with rheumatoid factor of multiple sites without organ or systems involvement: Secondary | ICD-10-CM | POA: Diagnosis not present

## 2017-08-05 DIAGNOSIS — E785 Hyperlipidemia, unspecified: Secondary | ICD-10-CM | POA: Diagnosis not present

## 2017-08-05 DIAGNOSIS — E1065 Type 1 diabetes mellitus with hyperglycemia: Secondary | ICD-10-CM | POA: Diagnosis not present

## 2017-08-05 DIAGNOSIS — I129 Hypertensive chronic kidney disease with stage 1 through stage 4 chronic kidney disease, or unspecified chronic kidney disease: Secondary | ICD-10-CM | POA: Diagnosis not present

## 2017-09-10 ENCOUNTER — Ambulatory Visit (INDEPENDENT_AMBULATORY_CARE_PROVIDER_SITE_OTHER): Payer: Medicare Other | Admitting: Ophthalmology

## 2017-09-10 DIAGNOSIS — H43813 Vitreous degeneration, bilateral: Secondary | ICD-10-CM | POA: Diagnosis not present

## 2017-09-10 DIAGNOSIS — H35033 Hypertensive retinopathy, bilateral: Secondary | ICD-10-CM

## 2017-09-10 DIAGNOSIS — I10 Essential (primary) hypertension: Secondary | ICD-10-CM

## 2017-09-10 DIAGNOSIS — E113293 Type 2 diabetes mellitus with mild nonproliferative diabetic retinopathy without macular edema, bilateral: Secondary | ICD-10-CM | POA: Diagnosis not present

## 2017-09-10 DIAGNOSIS — H2513 Age-related nuclear cataract, bilateral: Secondary | ICD-10-CM | POA: Diagnosis not present

## 2017-09-10 DIAGNOSIS — E11319 Type 2 diabetes mellitus with unspecified diabetic retinopathy without macular edema: Secondary | ICD-10-CM

## 2017-10-10 DIAGNOSIS — M0579 Rheumatoid arthritis with rheumatoid factor of multiple sites without organ or systems involvement: Secondary | ICD-10-CM | POA: Diagnosis not present

## 2017-10-10 DIAGNOSIS — Z79899 Other long term (current) drug therapy: Secondary | ICD-10-CM | POA: Diagnosis not present

## 2017-10-10 DIAGNOSIS — N182 Chronic kidney disease, stage 2 (mild): Secondary | ICD-10-CM | POA: Diagnosis not present

## 2017-10-10 DIAGNOSIS — Z23 Encounter for immunization: Secondary | ICD-10-CM | POA: Diagnosis not present

## 2017-10-10 DIAGNOSIS — D696 Thrombocytopenia, unspecified: Secondary | ICD-10-CM | POA: Diagnosis not present

## 2017-10-10 DIAGNOSIS — M810 Age-related osteoporosis without current pathological fracture: Secondary | ICD-10-CM | POA: Diagnosis not present

## 2017-10-10 DIAGNOSIS — M1990 Unspecified osteoarthritis, unspecified site: Secondary | ICD-10-CM | POA: Diagnosis not present

## 2017-10-10 DIAGNOSIS — E1065 Type 1 diabetes mellitus with hyperglycemia: Secondary | ICD-10-CM | POA: Diagnosis not present

## 2017-10-10 DIAGNOSIS — E785 Hyperlipidemia, unspecified: Secondary | ICD-10-CM | POA: Diagnosis not present

## 2017-10-17 DIAGNOSIS — R748 Abnormal levels of other serum enzymes: Secondary | ICD-10-CM | POA: Diagnosis not present

## 2017-10-17 DIAGNOSIS — E7800 Pure hypercholesterolemia, unspecified: Secondary | ICD-10-CM | POA: Diagnosis not present

## 2017-10-17 DIAGNOSIS — E1065 Type 1 diabetes mellitus with hyperglycemia: Secondary | ICD-10-CM | POA: Diagnosis not present

## 2017-10-17 DIAGNOSIS — I10 Essential (primary) hypertension: Secondary | ICD-10-CM | POA: Diagnosis not present

## 2017-10-17 DIAGNOSIS — N182 Chronic kidney disease, stage 2 (mild): Secondary | ICD-10-CM | POA: Diagnosis not present

## 2017-10-22 DIAGNOSIS — L814 Other melanin hyperpigmentation: Secondary | ICD-10-CM | POA: Diagnosis not present

## 2017-10-22 DIAGNOSIS — D1801 Hemangioma of skin and subcutaneous tissue: Secondary | ICD-10-CM | POA: Diagnosis not present

## 2017-10-22 DIAGNOSIS — L570 Actinic keratosis: Secondary | ICD-10-CM | POA: Diagnosis not present

## 2017-10-22 DIAGNOSIS — L819 Disorder of pigmentation, unspecified: Secondary | ICD-10-CM | POA: Diagnosis not present

## 2017-10-22 DIAGNOSIS — D225 Melanocytic nevi of trunk: Secondary | ICD-10-CM | POA: Diagnosis not present

## 2017-10-22 DIAGNOSIS — L821 Other seborrheic keratosis: Secondary | ICD-10-CM | POA: Diagnosis not present

## 2017-10-22 DIAGNOSIS — L918 Other hypertrophic disorders of the skin: Secondary | ICD-10-CM | POA: Diagnosis not present

## 2017-12-18 DIAGNOSIS — N182 Chronic kidney disease, stage 2 (mild): Secondary | ICD-10-CM | POA: Diagnosis not present

## 2017-12-18 DIAGNOSIS — E1065 Type 1 diabetes mellitus with hyperglycemia: Secondary | ICD-10-CM | POA: Diagnosis not present

## 2017-12-18 DIAGNOSIS — E785 Hyperlipidemia, unspecified: Secondary | ICD-10-CM | POA: Diagnosis not present

## 2017-12-18 DIAGNOSIS — R350 Frequency of micturition: Secondary | ICD-10-CM | POA: Diagnosis not present

## 2017-12-18 DIAGNOSIS — I129 Hypertensive chronic kidney disease with stage 1 through stage 4 chronic kidney disease, or unspecified chronic kidney disease: Secondary | ICD-10-CM | POA: Diagnosis not present

## 2018-01-01 DIAGNOSIS — E1065 Type 1 diabetes mellitus with hyperglycemia: Secondary | ICD-10-CM | POA: Diagnosis not present

## 2018-01-12 DIAGNOSIS — M810 Age-related osteoporosis without current pathological fracture: Secondary | ICD-10-CM | POA: Diagnosis not present

## 2018-01-12 DIAGNOSIS — Z79899 Other long term (current) drug therapy: Secondary | ICD-10-CM | POA: Diagnosis not present

## 2018-01-12 DIAGNOSIS — M1990 Unspecified osteoarthritis, unspecified site: Secondary | ICD-10-CM | POA: Diagnosis not present

## 2018-01-12 DIAGNOSIS — D696 Thrombocytopenia, unspecified: Secondary | ICD-10-CM | POA: Diagnosis not present

## 2018-01-12 DIAGNOSIS — M0579 Rheumatoid arthritis with rheumatoid factor of multiple sites without organ or systems involvement: Secondary | ICD-10-CM | POA: Diagnosis not present

## 2018-03-17 DIAGNOSIS — Z794 Long term (current) use of insulin: Secondary | ICD-10-CM | POA: Diagnosis not present

## 2018-03-17 DIAGNOSIS — H5213 Myopia, bilateral: Secondary | ICD-10-CM | POA: Diagnosis not present

## 2018-03-17 DIAGNOSIS — H524 Presbyopia: Secondary | ICD-10-CM | POA: Diagnosis not present

## 2018-03-17 DIAGNOSIS — H2513 Age-related nuclear cataract, bilateral: Secondary | ICD-10-CM | POA: Diagnosis not present

## 2018-03-17 DIAGNOSIS — H25013 Cortical age-related cataract, bilateral: Secondary | ICD-10-CM | POA: Diagnosis not present

## 2018-03-17 DIAGNOSIS — E109 Type 1 diabetes mellitus without complications: Secondary | ICD-10-CM | POA: Diagnosis not present

## 2018-03-17 DIAGNOSIS — H52203 Unspecified astigmatism, bilateral: Secondary | ICD-10-CM | POA: Diagnosis not present

## 2018-04-13 DIAGNOSIS — N182 Chronic kidney disease, stage 2 (mild): Secondary | ICD-10-CM | POA: Diagnosis not present

## 2018-04-13 DIAGNOSIS — M810 Age-related osteoporosis without current pathological fracture: Secondary | ICD-10-CM | POA: Diagnosis not present

## 2018-04-13 DIAGNOSIS — E1065 Type 1 diabetes mellitus with hyperglycemia: Secondary | ICD-10-CM | POA: Diagnosis not present

## 2018-04-13 DIAGNOSIS — M1990 Unspecified osteoarthritis, unspecified site: Secondary | ICD-10-CM | POA: Diagnosis not present

## 2018-04-13 DIAGNOSIS — E785 Hyperlipidemia, unspecified: Secondary | ICD-10-CM | POA: Diagnosis not present

## 2018-04-13 DIAGNOSIS — D72819 Decreased white blood cell count, unspecified: Secondary | ICD-10-CM | POA: Diagnosis not present

## 2018-04-13 DIAGNOSIS — Z0000 Encounter for general adult medical examination without abnormal findings: Secondary | ICD-10-CM | POA: Diagnosis not present

## 2018-04-13 DIAGNOSIS — D696 Thrombocytopenia, unspecified: Secondary | ICD-10-CM | POA: Diagnosis not present

## 2018-04-13 DIAGNOSIS — Z79899 Other long term (current) drug therapy: Secondary | ICD-10-CM | POA: Diagnosis not present

## 2018-04-13 DIAGNOSIS — M0579 Rheumatoid arthritis with rheumatoid factor of multiple sites without organ or systems involvement: Secondary | ICD-10-CM | POA: Diagnosis not present

## 2018-04-20 DIAGNOSIS — Z0000 Encounter for general adult medical examination without abnormal findings: Secondary | ICD-10-CM | POA: Diagnosis not present

## 2018-04-20 DIAGNOSIS — E1022 Type 1 diabetes mellitus with diabetic chronic kidney disease: Secondary | ICD-10-CM | POA: Diagnosis not present

## 2018-04-20 DIAGNOSIS — D709 Neutropenia, unspecified: Secondary | ICD-10-CM | POA: Diagnosis not present

## 2018-04-20 DIAGNOSIS — E785 Hyperlipidemia, unspecified: Secondary | ICD-10-CM | POA: Diagnosis not present

## 2018-04-20 DIAGNOSIS — Z125 Encounter for screening for malignant neoplasm of prostate: Secondary | ICD-10-CM | POA: Diagnosis not present

## 2018-04-20 DIAGNOSIS — N182 Chronic kidney disease, stage 2 (mild): Secondary | ICD-10-CM | POA: Diagnosis not present

## 2018-04-20 DIAGNOSIS — M810 Age-related osteoporosis without current pathological fracture: Secondary | ICD-10-CM | POA: Diagnosis not present

## 2018-04-20 DIAGNOSIS — F17211 Nicotine dependence, cigarettes, in remission: Secondary | ICD-10-CM | POA: Diagnosis not present

## 2018-04-20 DIAGNOSIS — E875 Hyperkalemia: Secondary | ICD-10-CM | POA: Diagnosis not present

## 2018-04-20 DIAGNOSIS — I129 Hypertensive chronic kidney disease with stage 1 through stage 4 chronic kidney disease, or unspecified chronic kidney disease: Secondary | ICD-10-CM | POA: Diagnosis not present

## 2018-04-20 DIAGNOSIS — R351 Nocturia: Secondary | ICD-10-CM | POA: Diagnosis not present

## 2018-05-05 DIAGNOSIS — E785 Hyperlipidemia, unspecified: Secondary | ICD-10-CM | POA: Diagnosis not present

## 2018-05-05 DIAGNOSIS — E1065 Type 1 diabetes mellitus with hyperglycemia: Secondary | ICD-10-CM | POA: Diagnosis not present

## 2018-05-05 DIAGNOSIS — I129 Hypertensive chronic kidney disease with stage 1 through stage 4 chronic kidney disease, or unspecified chronic kidney disease: Secondary | ICD-10-CM | POA: Diagnosis not present

## 2018-07-13 DIAGNOSIS — Z79899 Other long term (current) drug therapy: Secondary | ICD-10-CM | POA: Diagnosis not present

## 2018-07-13 DIAGNOSIS — M0579 Rheumatoid arthritis with rheumatoid factor of multiple sites without organ or systems involvement: Secondary | ICD-10-CM | POA: Diagnosis not present

## 2018-07-13 DIAGNOSIS — M810 Age-related osteoporosis without current pathological fracture: Secondary | ICD-10-CM | POA: Diagnosis not present

## 2018-07-13 DIAGNOSIS — M1990 Unspecified osteoarthritis, unspecified site: Secondary | ICD-10-CM | POA: Diagnosis not present

## 2018-07-13 DIAGNOSIS — D696 Thrombocytopenia, unspecified: Secondary | ICD-10-CM | POA: Diagnosis not present

## 2018-07-13 DIAGNOSIS — D72819 Decreased white blood cell count, unspecified: Secondary | ICD-10-CM | POA: Diagnosis not present

## 2018-09-10 ENCOUNTER — Encounter (INDEPENDENT_AMBULATORY_CARE_PROVIDER_SITE_OTHER): Payer: Medicare Other | Admitting: Ophthalmology

## 2018-09-10 DIAGNOSIS — E11319 Type 2 diabetes mellitus with unspecified diabetic retinopathy without macular edema: Secondary | ICD-10-CM | POA: Diagnosis not present

## 2018-09-10 DIAGNOSIS — E113292 Type 2 diabetes mellitus with mild nonproliferative diabetic retinopathy without macular edema, left eye: Secondary | ICD-10-CM

## 2018-09-10 DIAGNOSIS — H43813 Vitreous degeneration, bilateral: Secondary | ICD-10-CM | POA: Diagnosis not present

## 2018-09-10 DIAGNOSIS — I10 Essential (primary) hypertension: Secondary | ICD-10-CM

## 2018-09-10 DIAGNOSIS — H35372 Puckering of macula, left eye: Secondary | ICD-10-CM

## 2018-09-10 DIAGNOSIS — E113391 Type 2 diabetes mellitus with moderate nonproliferative diabetic retinopathy without macular edema, right eye: Secondary | ICD-10-CM | POA: Diagnosis not present

## 2018-09-10 DIAGNOSIS — H2513 Age-related nuclear cataract, bilateral: Secondary | ICD-10-CM | POA: Diagnosis not present

## 2018-09-10 DIAGNOSIS — H35033 Hypertensive retinopathy, bilateral: Secondary | ICD-10-CM | POA: Diagnosis not present

## 2018-10-12 DIAGNOSIS — M1990 Unspecified osteoarthritis, unspecified site: Secondary | ICD-10-CM | POA: Diagnosis not present

## 2018-10-12 DIAGNOSIS — D696 Thrombocytopenia, unspecified: Secondary | ICD-10-CM | POA: Diagnosis not present

## 2018-10-12 DIAGNOSIS — M0579 Rheumatoid arthritis with rheumatoid factor of multiple sites without organ or systems involvement: Secondary | ICD-10-CM | POA: Diagnosis not present

## 2018-10-12 DIAGNOSIS — M810 Age-related osteoporosis without current pathological fracture: Secondary | ICD-10-CM | POA: Diagnosis not present

## 2018-10-12 DIAGNOSIS — Z79899 Other long term (current) drug therapy: Secondary | ICD-10-CM | POA: Diagnosis not present

## 2018-10-12 DIAGNOSIS — Z13820 Encounter for screening for osteoporosis: Secondary | ICD-10-CM | POA: Diagnosis not present

## 2018-10-12 DIAGNOSIS — D72819 Decreased white blood cell count, unspecified: Secondary | ICD-10-CM | POA: Diagnosis not present

## 2018-10-14 DIAGNOSIS — R748 Abnormal levels of other serum enzymes: Secondary | ICD-10-CM | POA: Diagnosis not present

## 2018-10-14 DIAGNOSIS — E785 Hyperlipidemia, unspecified: Secondary | ICD-10-CM | POA: Diagnosis not present

## 2018-10-14 DIAGNOSIS — N182 Chronic kidney disease, stage 2 (mild): Secondary | ICD-10-CM | POA: Diagnosis not present

## 2018-10-14 DIAGNOSIS — E1065 Type 1 diabetes mellitus with hyperglycemia: Secondary | ICD-10-CM | POA: Diagnosis not present

## 2018-10-21 DIAGNOSIS — I129 Hypertensive chronic kidney disease with stage 1 through stage 4 chronic kidney disease, or unspecified chronic kidney disease: Secondary | ICD-10-CM | POA: Diagnosis not present

## 2018-10-21 DIAGNOSIS — E1022 Type 1 diabetes mellitus with diabetic chronic kidney disease: Secondary | ICD-10-CM | POA: Diagnosis not present

## 2018-10-21 DIAGNOSIS — N182 Chronic kidney disease, stage 2 (mild): Secondary | ICD-10-CM | POA: Diagnosis not present

## 2018-10-21 DIAGNOSIS — E538 Deficiency of other specified B group vitamins: Secondary | ICD-10-CM | POA: Diagnosis not present

## 2018-10-21 DIAGNOSIS — E1065 Type 1 diabetes mellitus with hyperglycemia: Secondary | ICD-10-CM | POA: Diagnosis not present

## 2018-10-21 DIAGNOSIS — D709 Neutropenia, unspecified: Secondary | ICD-10-CM | POA: Diagnosis not present

## 2018-10-27 DIAGNOSIS — E1065 Type 1 diabetes mellitus with hyperglycemia: Secondary | ICD-10-CM | POA: Diagnosis not present

## 2018-10-30 DIAGNOSIS — R0789 Other chest pain: Secondary | ICD-10-CM | POA: Diagnosis not present

## 2019-01-12 DIAGNOSIS — M810 Age-related osteoporosis without current pathological fracture: Secondary | ICD-10-CM | POA: Diagnosis not present

## 2019-01-12 DIAGNOSIS — E1169 Type 2 diabetes mellitus with other specified complication: Secondary | ICD-10-CM | POA: Diagnosis not present

## 2019-01-12 DIAGNOSIS — M1990 Unspecified osteoarthritis, unspecified site: Secondary | ICD-10-CM | POA: Diagnosis not present

## 2019-01-12 DIAGNOSIS — D72819 Decreased white blood cell count, unspecified: Secondary | ICD-10-CM | POA: Diagnosis not present

## 2019-01-12 DIAGNOSIS — J329 Chronic sinusitis, unspecified: Secondary | ICD-10-CM | POA: Diagnosis not present

## 2019-01-12 DIAGNOSIS — D696 Thrombocytopenia, unspecified: Secondary | ICD-10-CM | POA: Diagnosis not present

## 2019-01-12 DIAGNOSIS — Z79899 Other long term (current) drug therapy: Secondary | ICD-10-CM | POA: Diagnosis not present

## 2019-01-12 DIAGNOSIS — M0579 Rheumatoid arthritis with rheumatoid factor of multiple sites without organ or systems involvement: Secondary | ICD-10-CM | POA: Diagnosis not present

## 2019-01-26 DIAGNOSIS — I129 Hypertensive chronic kidney disease with stage 1 through stage 4 chronic kidney disease, or unspecified chronic kidney disease: Secondary | ICD-10-CM | POA: Diagnosis not present

## 2019-01-26 DIAGNOSIS — E785 Hyperlipidemia, unspecified: Secondary | ICD-10-CM | POA: Diagnosis not present

## 2019-01-26 DIAGNOSIS — E1065 Type 1 diabetes mellitus with hyperglycemia: Secondary | ICD-10-CM | POA: Diagnosis not present

## 2019-02-12 DIAGNOSIS — M810 Age-related osteoporosis without current pathological fracture: Secondary | ICD-10-CM | POA: Diagnosis not present

## 2019-04-26 DIAGNOSIS — D72819 Decreased white blood cell count, unspecified: Secondary | ICD-10-CM | POA: Diagnosis not present

## 2019-04-26 DIAGNOSIS — E1169 Type 2 diabetes mellitus with other specified complication: Secondary | ICD-10-CM | POA: Diagnosis not present

## 2019-04-26 DIAGNOSIS — E1065 Type 1 diabetes mellitus with hyperglycemia: Secondary | ICD-10-CM | POA: Diagnosis not present

## 2019-04-26 DIAGNOSIS — M0579 Rheumatoid arthritis with rheumatoid factor of multiple sites without organ or systems involvement: Secondary | ICD-10-CM | POA: Diagnosis not present

## 2019-04-26 DIAGNOSIS — D696 Thrombocytopenia, unspecified: Secondary | ICD-10-CM | POA: Diagnosis not present

## 2019-04-26 DIAGNOSIS — M810 Age-related osteoporosis without current pathological fracture: Secondary | ICD-10-CM | POA: Diagnosis not present

## 2019-04-26 DIAGNOSIS — Z79899 Other long term (current) drug therapy: Secondary | ICD-10-CM | POA: Diagnosis not present

## 2019-04-26 DIAGNOSIS — M1990 Unspecified osteoarthritis, unspecified site: Secondary | ICD-10-CM | POA: Diagnosis not present

## 2019-04-26 DIAGNOSIS — J329 Chronic sinusitis, unspecified: Secondary | ICD-10-CM | POA: Diagnosis not present

## 2019-05-05 DIAGNOSIS — E538 Deficiency of other specified B group vitamins: Secondary | ICD-10-CM | POA: Diagnosis not present

## 2019-05-05 DIAGNOSIS — N182 Chronic kidney disease, stage 2 (mild): Secondary | ICD-10-CM | POA: Diagnosis not present

## 2019-05-05 DIAGNOSIS — Z7189 Other specified counseling: Secondary | ICD-10-CM | POA: Diagnosis not present

## 2019-05-05 DIAGNOSIS — D709 Neutropenia, unspecified: Secondary | ICD-10-CM | POA: Diagnosis not present

## 2019-05-05 DIAGNOSIS — Z0000 Encounter for general adult medical examination without abnormal findings: Secondary | ICD-10-CM | POA: Diagnosis not present

## 2019-05-05 DIAGNOSIS — E1022 Type 1 diabetes mellitus with diabetic chronic kidney disease: Secondary | ICD-10-CM | POA: Diagnosis not present

## 2019-05-05 DIAGNOSIS — I129 Hypertensive chronic kidney disease with stage 1 through stage 4 chronic kidney disease, or unspecified chronic kidney disease: Secondary | ICD-10-CM | POA: Diagnosis not present

## 2019-05-05 DIAGNOSIS — E1065 Type 1 diabetes mellitus with hyperglycemia: Secondary | ICD-10-CM | POA: Diagnosis not present

## 2019-05-05 DIAGNOSIS — Z125 Encounter for screening for malignant neoplasm of prostate: Secondary | ICD-10-CM | POA: Diagnosis not present

## 2019-05-12 DIAGNOSIS — D709 Neutropenia, unspecified: Secondary | ICD-10-CM | POA: Diagnosis not present

## 2019-05-12 DIAGNOSIS — I129 Hypertensive chronic kidney disease with stage 1 through stage 4 chronic kidney disease, or unspecified chronic kidney disease: Secondary | ICD-10-CM | POA: Diagnosis not present

## 2019-05-12 DIAGNOSIS — M069 Rheumatoid arthritis, unspecified: Secondary | ICD-10-CM | POA: Diagnosis not present

## 2019-05-12 DIAGNOSIS — E1065 Type 1 diabetes mellitus with hyperglycemia: Secondary | ICD-10-CM | POA: Diagnosis not present

## 2019-05-12 DIAGNOSIS — M810 Age-related osteoporosis without current pathological fracture: Secondary | ICD-10-CM | POA: Diagnosis not present

## 2019-05-12 DIAGNOSIS — E785 Hyperlipidemia, unspecified: Secondary | ICD-10-CM | POA: Diagnosis not present

## 2019-05-12 DIAGNOSIS — Z7189 Other specified counseling: Secondary | ICD-10-CM | POA: Diagnosis not present

## 2019-05-12 DIAGNOSIS — E538 Deficiency of other specified B group vitamins: Secondary | ICD-10-CM | POA: Diagnosis not present

## 2019-05-12 DIAGNOSIS — E1022 Type 1 diabetes mellitus with diabetic chronic kidney disease: Secondary | ICD-10-CM | POA: Diagnosis not present

## 2019-05-17 DIAGNOSIS — D709 Neutropenia, unspecified: Secondary | ICD-10-CM | POA: Diagnosis not present

## 2019-05-20 DIAGNOSIS — I714 Abdominal aortic aneurysm, without rupture: Secondary | ICD-10-CM | POA: Diagnosis not present

## 2019-05-20 DIAGNOSIS — E1065 Type 1 diabetes mellitus with hyperglycemia: Secondary | ICD-10-CM | POA: Diagnosis not present

## 2019-06-07 DIAGNOSIS — H25813 Combined forms of age-related cataract, bilateral: Secondary | ICD-10-CM | POA: Diagnosis not present

## 2019-06-07 DIAGNOSIS — E109 Type 1 diabetes mellitus without complications: Secondary | ICD-10-CM | POA: Diagnosis not present

## 2019-07-05 DIAGNOSIS — E1065 Type 1 diabetes mellitus with hyperglycemia: Secondary | ICD-10-CM | POA: Diagnosis not present

## 2019-07-05 DIAGNOSIS — Z9641 Presence of insulin pump (external) (internal): Secondary | ICD-10-CM | POA: Diagnosis not present

## 2019-07-27 DIAGNOSIS — Z7189 Other specified counseling: Secondary | ICD-10-CM | POA: Diagnosis not present

## 2019-07-27 DIAGNOSIS — M0579 Rheumatoid arthritis with rheumatoid factor of multiple sites without organ or systems involvement: Secondary | ICD-10-CM | POA: Diagnosis not present

## 2019-07-27 DIAGNOSIS — Z79899 Other long term (current) drug therapy: Secondary | ICD-10-CM | POA: Diagnosis not present

## 2019-07-27 DIAGNOSIS — Z9641 Presence of insulin pump (external) (internal): Secondary | ICD-10-CM | POA: Diagnosis not present

## 2019-07-27 DIAGNOSIS — M810 Age-related osteoporosis without current pathological fracture: Secondary | ICD-10-CM | POA: Diagnosis not present

## 2019-07-27 DIAGNOSIS — D696 Thrombocytopenia, unspecified: Secondary | ICD-10-CM | POA: Diagnosis not present

## 2019-07-27 DIAGNOSIS — E1065 Type 1 diabetes mellitus with hyperglycemia: Secondary | ICD-10-CM | POA: Diagnosis not present

## 2019-07-27 DIAGNOSIS — E1169 Type 2 diabetes mellitus with other specified complication: Secondary | ICD-10-CM | POA: Diagnosis not present

## 2019-07-27 DIAGNOSIS — D72819 Decreased white blood cell count, unspecified: Secondary | ICD-10-CM | POA: Diagnosis not present

## 2019-07-27 DIAGNOSIS — M1990 Unspecified osteoarthritis, unspecified site: Secondary | ICD-10-CM | POA: Diagnosis not present

## 2019-08-18 DIAGNOSIS — M810 Age-related osteoporosis without current pathological fracture: Secondary | ICD-10-CM | POA: Diagnosis not present

## 2019-09-13 ENCOUNTER — Other Ambulatory Visit: Payer: Self-pay

## 2019-09-13 ENCOUNTER — Encounter (INDEPENDENT_AMBULATORY_CARE_PROVIDER_SITE_OTHER): Payer: Medicare Other | Admitting: Ophthalmology

## 2019-09-13 DIAGNOSIS — H35033 Hypertensive retinopathy, bilateral: Secondary | ICD-10-CM | POA: Diagnosis not present

## 2019-09-13 DIAGNOSIS — H2513 Age-related nuclear cataract, bilateral: Secondary | ICD-10-CM

## 2019-09-13 DIAGNOSIS — E113291 Type 2 diabetes mellitus with mild nonproliferative diabetic retinopathy without macular edema, right eye: Secondary | ICD-10-CM | POA: Diagnosis not present

## 2019-09-13 DIAGNOSIS — E11319 Type 2 diabetes mellitus with unspecified diabetic retinopathy without macular edema: Secondary | ICD-10-CM | POA: Diagnosis not present

## 2019-09-13 DIAGNOSIS — I10 Essential (primary) hypertension: Secondary | ICD-10-CM | POA: Diagnosis not present

## 2019-09-13 DIAGNOSIS — H43813 Vitreous degeneration, bilateral: Secondary | ICD-10-CM

## 2019-09-13 DIAGNOSIS — E113392 Type 2 diabetes mellitus with moderate nonproliferative diabetic retinopathy without macular edema, left eye: Secondary | ICD-10-CM | POA: Diagnosis not present

## 2019-09-13 DIAGNOSIS — Z23 Encounter for immunization: Secondary | ICD-10-CM | POA: Diagnosis not present

## 2019-10-20 DIAGNOSIS — E1065 Type 1 diabetes mellitus with hyperglycemia: Secondary | ICD-10-CM | POA: Diagnosis not present

## 2019-10-20 DIAGNOSIS — I129 Hypertensive chronic kidney disease with stage 1 through stage 4 chronic kidney disease, or unspecified chronic kidney disease: Secondary | ICD-10-CM | POA: Diagnosis not present

## 2019-10-20 DIAGNOSIS — E785 Hyperlipidemia, unspecified: Secondary | ICD-10-CM | POA: Diagnosis not present

## 2019-10-20 DIAGNOSIS — N182 Chronic kidney disease, stage 2 (mild): Secondary | ICD-10-CM | POA: Diagnosis not present

## 2019-10-20 DIAGNOSIS — E1022 Type 1 diabetes mellitus with diabetic chronic kidney disease: Secondary | ICD-10-CM | POA: Diagnosis not present

## 2019-10-27 DIAGNOSIS — Z794 Long term (current) use of insulin: Secondary | ICD-10-CM | POA: Diagnosis not present

## 2019-10-27 DIAGNOSIS — E785 Hyperlipidemia, unspecified: Secondary | ICD-10-CM | POA: Diagnosis not present

## 2019-10-27 DIAGNOSIS — N182 Chronic kidney disease, stage 2 (mild): Secondary | ICD-10-CM | POA: Diagnosis not present

## 2019-10-27 DIAGNOSIS — E1065 Type 1 diabetes mellitus with hyperglycemia: Secondary | ICD-10-CM | POA: Diagnosis not present

## 2019-10-27 DIAGNOSIS — Z9641 Presence of insulin pump (external) (internal): Secondary | ICD-10-CM | POA: Diagnosis not present

## 2019-10-27 DIAGNOSIS — Z7189 Other specified counseling: Secondary | ICD-10-CM | POA: Diagnosis not present

## 2019-10-27 DIAGNOSIS — E1169 Type 2 diabetes mellitus with other specified complication: Secondary | ICD-10-CM | POA: Diagnosis not present

## 2019-10-27 DIAGNOSIS — I129 Hypertensive chronic kidney disease with stage 1 through stage 4 chronic kidney disease, or unspecified chronic kidney disease: Secondary | ICD-10-CM | POA: Diagnosis not present

## 2019-11-08 DIAGNOSIS — M0579 Rheumatoid arthritis with rheumatoid factor of multiple sites without organ or systems involvement: Secondary | ICD-10-CM | POA: Diagnosis not present

## 2019-11-08 DIAGNOSIS — D72819 Decreased white blood cell count, unspecified: Secondary | ICD-10-CM | POA: Diagnosis not present

## 2019-11-08 DIAGNOSIS — Z79899 Other long term (current) drug therapy: Secondary | ICD-10-CM | POA: Diagnosis not present

## 2019-11-08 DIAGNOSIS — E785 Hyperlipidemia, unspecified: Secondary | ICD-10-CM | POA: Diagnosis not present

## 2019-11-08 DIAGNOSIS — E1065 Type 1 diabetes mellitus with hyperglycemia: Secondary | ICD-10-CM | POA: Diagnosis not present

## 2019-11-08 DIAGNOSIS — M810 Age-related osteoporosis without current pathological fracture: Secondary | ICD-10-CM | POA: Diagnosis not present

## 2019-11-08 DIAGNOSIS — M1990 Unspecified osteoarthritis, unspecified site: Secondary | ICD-10-CM | POA: Diagnosis not present

## 2020-01-27 DIAGNOSIS — Z794 Long term (current) use of insulin: Secondary | ICD-10-CM | POA: Diagnosis not present

## 2020-01-27 DIAGNOSIS — M810 Age-related osteoporosis without current pathological fracture: Secondary | ICD-10-CM | POA: Diagnosis not present

## 2020-01-27 DIAGNOSIS — E785 Hyperlipidemia, unspecified: Secondary | ICD-10-CM | POA: Diagnosis not present

## 2020-01-27 DIAGNOSIS — Z9641 Presence of insulin pump (external) (internal): Secondary | ICD-10-CM | POA: Diagnosis not present

## 2020-01-27 DIAGNOSIS — I129 Hypertensive chronic kidney disease with stage 1 through stage 4 chronic kidney disease, or unspecified chronic kidney disease: Secondary | ICD-10-CM | POA: Diagnosis not present

## 2020-01-27 DIAGNOSIS — E1022 Type 1 diabetes mellitus with diabetic chronic kidney disease: Secondary | ICD-10-CM | POA: Diagnosis not present

## 2020-01-27 DIAGNOSIS — E538 Deficiency of other specified B group vitamins: Secondary | ICD-10-CM | POA: Diagnosis not present

## 2020-01-27 DIAGNOSIS — E1065 Type 1 diabetes mellitus with hyperglycemia: Secondary | ICD-10-CM | POA: Diagnosis not present

## 2020-01-27 DIAGNOSIS — N182 Chronic kidney disease, stage 2 (mild): Secondary | ICD-10-CM | POA: Diagnosis not present

## 2020-02-08 DIAGNOSIS — D72819 Decreased white blood cell count, unspecified: Secondary | ICD-10-CM | POA: Diagnosis not present

## 2020-02-08 DIAGNOSIS — M810 Age-related osteoporosis without current pathological fracture: Secondary | ICD-10-CM | POA: Diagnosis not present

## 2020-02-08 DIAGNOSIS — E785 Hyperlipidemia, unspecified: Secondary | ICD-10-CM | POA: Diagnosis not present

## 2020-02-08 DIAGNOSIS — Z79899 Other long term (current) drug therapy: Secondary | ICD-10-CM | POA: Diagnosis not present

## 2020-02-08 DIAGNOSIS — M1990 Unspecified osteoarthritis, unspecified site: Secondary | ICD-10-CM | POA: Diagnosis not present

## 2020-02-08 DIAGNOSIS — E1065 Type 1 diabetes mellitus with hyperglycemia: Secondary | ICD-10-CM | POA: Diagnosis not present

## 2020-02-08 DIAGNOSIS — M0579 Rheumatoid arthritis with rheumatoid factor of multiple sites without organ or systems involvement: Secondary | ICD-10-CM | POA: Diagnosis not present

## 2020-02-23 DIAGNOSIS — M810 Age-related osteoporosis without current pathological fracture: Secondary | ICD-10-CM | POA: Diagnosis not present

## 2020-09-14 ENCOUNTER — Other Ambulatory Visit: Payer: Self-pay

## 2020-09-14 ENCOUNTER — Encounter (INDEPENDENT_AMBULATORY_CARE_PROVIDER_SITE_OTHER): Payer: Medicare Other | Admitting: Ophthalmology

## 2020-09-14 DIAGNOSIS — I10 Essential (primary) hypertension: Secondary | ICD-10-CM

## 2020-09-14 DIAGNOSIS — E113293 Type 2 diabetes mellitus with mild nonproliferative diabetic retinopathy without macular edema, bilateral: Secondary | ICD-10-CM | POA: Diagnosis not present

## 2020-09-14 DIAGNOSIS — H35033 Hypertensive retinopathy, bilateral: Secondary | ICD-10-CM

## 2020-09-14 DIAGNOSIS — E11319 Type 2 diabetes mellitus with unspecified diabetic retinopathy without macular edema: Secondary | ICD-10-CM

## 2020-09-14 DIAGNOSIS — H43813 Vitreous degeneration, bilateral: Secondary | ICD-10-CM

## 2020-09-14 DIAGNOSIS — H2513 Age-related nuclear cataract, bilateral: Secondary | ICD-10-CM

## 2021-09-17 ENCOUNTER — Other Ambulatory Visit: Payer: Self-pay

## 2021-09-17 ENCOUNTER — Encounter (INDEPENDENT_AMBULATORY_CARE_PROVIDER_SITE_OTHER): Payer: Medicare Other | Admitting: Ophthalmology

## 2021-09-17 DIAGNOSIS — H43813 Vitreous degeneration, bilateral: Secondary | ICD-10-CM | POA: Diagnosis not present

## 2021-09-17 DIAGNOSIS — I10 Essential (primary) hypertension: Secondary | ICD-10-CM

## 2021-09-17 DIAGNOSIS — E113293 Type 2 diabetes mellitus with mild nonproliferative diabetic retinopathy without macular edema, bilateral: Secondary | ICD-10-CM

## 2021-09-17 DIAGNOSIS — H2513 Age-related nuclear cataract, bilateral: Secondary | ICD-10-CM

## 2021-09-17 DIAGNOSIS — H35033 Hypertensive retinopathy, bilateral: Secondary | ICD-10-CM

## 2021-09-24 ENCOUNTER — Encounter (INDEPENDENT_AMBULATORY_CARE_PROVIDER_SITE_OTHER): Payer: Medicare Other | Admitting: Ophthalmology

## 2022-09-25 ENCOUNTER — Encounter (INDEPENDENT_AMBULATORY_CARE_PROVIDER_SITE_OTHER): Payer: Medicare Other | Admitting: Ophthalmology

## 2022-09-30 ENCOUNTER — Encounter (INDEPENDENT_AMBULATORY_CARE_PROVIDER_SITE_OTHER): Payer: Medicare Other | Admitting: Ophthalmology

## 2022-09-30 DIAGNOSIS — H35033 Hypertensive retinopathy, bilateral: Secondary | ICD-10-CM | POA: Diagnosis not present

## 2022-09-30 DIAGNOSIS — H43813 Vitreous degeneration, bilateral: Secondary | ICD-10-CM

## 2022-09-30 DIAGNOSIS — E113391 Type 2 diabetes mellitus with moderate nonproliferative diabetic retinopathy without macular edema, right eye: Secondary | ICD-10-CM

## 2022-09-30 DIAGNOSIS — E113292 Type 2 diabetes mellitus with mild nonproliferative diabetic retinopathy without macular edema, left eye: Secondary | ICD-10-CM | POA: Diagnosis not present

## 2022-09-30 DIAGNOSIS — I10 Essential (primary) hypertension: Secondary | ICD-10-CM | POA: Diagnosis not present

## 2022-09-30 DIAGNOSIS — D3132 Benign neoplasm of left choroid: Secondary | ICD-10-CM

## 2022-09-30 DIAGNOSIS — H35371 Puckering of macula, right eye: Secondary | ICD-10-CM

## 2023-10-06 ENCOUNTER — Encounter (INDEPENDENT_AMBULATORY_CARE_PROVIDER_SITE_OTHER): Payer: Medicare Other | Admitting: Ophthalmology

## 2023-10-06 DIAGNOSIS — Z794 Long term (current) use of insulin: Secondary | ICD-10-CM

## 2023-10-06 DIAGNOSIS — I10 Essential (primary) hypertension: Secondary | ICD-10-CM | POA: Diagnosis not present

## 2023-10-06 DIAGNOSIS — E113293 Type 2 diabetes mellitus with mild nonproliferative diabetic retinopathy without macular edema, bilateral: Secondary | ICD-10-CM | POA: Diagnosis not present

## 2023-10-06 DIAGNOSIS — H35033 Hypertensive retinopathy, bilateral: Secondary | ICD-10-CM

## 2023-10-06 DIAGNOSIS — H35371 Puckering of macula, right eye: Secondary | ICD-10-CM

## 2023-10-06 DIAGNOSIS — D3132 Benign neoplasm of left choroid: Secondary | ICD-10-CM

## 2023-10-06 DIAGNOSIS — H43813 Vitreous degeneration, bilateral: Secondary | ICD-10-CM

## 2024-10-04 ENCOUNTER — Encounter (INDEPENDENT_AMBULATORY_CARE_PROVIDER_SITE_OTHER): Payer: Medicare Other | Admitting: Ophthalmology

## 2024-10-04 DIAGNOSIS — Z794 Long term (current) use of insulin: Secondary | ICD-10-CM

## 2024-10-04 DIAGNOSIS — H35373 Puckering of macula, bilateral: Secondary | ICD-10-CM

## 2024-10-04 DIAGNOSIS — E113293 Type 2 diabetes mellitus with mild nonproliferative diabetic retinopathy without macular edema, bilateral: Secondary | ICD-10-CM

## 2024-10-04 DIAGNOSIS — H26493 Other secondary cataract, bilateral: Secondary | ICD-10-CM | POA: Diagnosis not present

## 2024-10-04 DIAGNOSIS — H43813 Vitreous degeneration, bilateral: Secondary | ICD-10-CM

## 2024-10-04 DIAGNOSIS — I10 Essential (primary) hypertension: Secondary | ICD-10-CM

## 2024-10-04 DIAGNOSIS — H35033 Hypertensive retinopathy, bilateral: Secondary | ICD-10-CM | POA: Diagnosis not present

## 2024-11-03 ENCOUNTER — Encounter (INDEPENDENT_AMBULATORY_CARE_PROVIDER_SITE_OTHER): Admitting: Ophthalmology

## 2025-11-07 ENCOUNTER — Encounter (INDEPENDENT_AMBULATORY_CARE_PROVIDER_SITE_OTHER): Admitting: Ophthalmology
